# Patient Record
Sex: Female | Born: 1990 | Race: Black or African American | Hispanic: No | Marital: Single | Smoking: Current every day smoker
Health system: Southern US, Community
[De-identification: ages and names within clinical notes are randomized; demographics above are authoritative.]

## PROBLEM LIST (undated history)

## (undated) DIAGNOSIS — R011 Cardiac murmur, unspecified: Secondary | ICD-10-CM

## (undated) DIAGNOSIS — R51 Headache: Secondary | ICD-10-CM

## (undated) HISTORY — PX: CYST REMOVAL LEG: SHX6280

## (undated) HISTORY — PX: WISDOM TOOTH EXTRACTION: SHX21

## (undated) HISTORY — PX: OTHER SURGICAL HISTORY: SHX169

## (undated) HISTORY — PX: TYMPANOSTOMY TUBE PLACEMENT: SHX32

## (undated) HISTORY — DX: Headache: R51

---
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
## 2012-01-25 ENCOUNTER — Encounter: Payer: BC Managed Care – PPO | Admitting: Obstetrics and Gynecology

## 2012-01-25 ENCOUNTER — Telehealth: Payer: Self-pay | Admitting: Obstetrics and Gynecology

## 2012-01-25 NOTE — Telephone Encounter (Signed)
ND has available apts on 02/04/2012

## 2012-02-04 ENCOUNTER — Ambulatory Visit (INDEPENDENT_AMBULATORY_CARE_PROVIDER_SITE_OTHER): Payer: BC Managed Care – PPO | Admitting: Obstetrics and Gynecology

## 2012-02-04 ENCOUNTER — Encounter: Payer: Self-pay | Admitting: Obstetrics and Gynecology

## 2012-02-04 VITALS — BP 110/60 | Ht 63.0 in | Wt 129.0 lb

## 2012-02-04 DIAGNOSIS — Z202 Contact with and (suspected) exposure to infections with a predominantly sexual mode of transmission: Secondary | ICD-10-CM

## 2012-02-04 DIAGNOSIS — A749 Chlamydial infection, unspecified: Secondary | ICD-10-CM

## 2012-02-04 DIAGNOSIS — Z2089 Contact with and (suspected) exposure to other communicable diseases: Secondary | ICD-10-CM

## 2012-02-04 NOTE — Progress Notes (Signed)
TOC chlamydia.  Pt without c/o BP 110/60  Ht 5\' 3"  (1.6 m)  Wt 129 lb (58.514 kg)  BMI 22.85 kg/m2  LMP 12/27/2011 Physical Examination: General appearance - alert, well appearing, and in no distress Abdomen - soft, nontender, nondistended, no masses or organomegaly Pelvic - normal external genitalia, vulva, vagina, cervix, uterus and adnexa Chlamydia infection Pt and partner txd TOC done today RT for aex

## 2012-02-22 ENCOUNTER — Telehealth: Payer: Self-pay | Admitting: Obstetrics and Gynecology

## 2012-02-22 NOTE — Telephone Encounter (Signed)
Tc to pt per telephone call. Informed pt GC/CT-both negative. Pt voices understanding.

## 2012-07-17 ENCOUNTER — Encounter (HOSPITAL_COMMUNITY): Payer: Self-pay

## 2012-07-17 ENCOUNTER — Emergency Department (INDEPENDENT_AMBULATORY_CARE_PROVIDER_SITE_OTHER)
Admission: EM | Admit: 2012-07-17 | Discharge: 2012-07-17 | Disposition: A | Discharge status: Home / Self Care | Payer: Self-pay | Source: Home / Self Care

## 2012-07-17 DIAGNOSIS — S46819A Strain of other muscles, fascia and tendons at shoulder and upper arm level, unspecified arm, initial encounter: Secondary | ICD-10-CM

## 2012-07-17 DIAGNOSIS — S5010XA Contusion of unspecified forearm, initial encounter: Secondary | ICD-10-CM

## 2012-07-17 DIAGNOSIS — IMO0002 Reserved for concepts with insufficient information to code with codable children: Secondary | ICD-10-CM

## 2012-07-17 DIAGNOSIS — S5012XA Contusion of left forearm, initial encounter: Secondary | ICD-10-CM

## 2012-07-17 DIAGNOSIS — S60511A Abrasion of right hand, initial encounter: Secondary | ICD-10-CM

## 2012-07-17 DIAGNOSIS — S46811A Strain of other muscles, fascia and tendons at shoulder and upper arm level, right arm, initial encounter: Secondary | ICD-10-CM
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
## 2012-08-01 NOTE — ED Provider Notes (Signed)
Medical screening examination/treatment/procedure(s) were performed by resident physician or non-physician practitioner and as supervising physician I was immediately available for consultation/collaboration.   Barkley Bruns MD.   Linna Hoff, MD 08/01/12 [phone]

## 2012-11-24 DIAGNOSIS — A749 Chlamydial infection, unspecified: Secondary | ICD-10-CM | POA: Insufficient documentation

## 2013-01-25 NOTE — L&D Delivery Note (Signed)
Delivery Note At 7:02 PM a viable female was delivered via Vaginal, Spontaneous Delivery (Presentation: Left Occiput Anterior).  APGAR: 8, 9; weight pending.   Placenta status: Intact, Spontaneous.  Cord: 3 vessels with the following complications: None.  Cord pH: not collected  Anesthesia: Epidural  Episiotomy: None Lacerations: Vaginal left side Suture Repair: 3.0 vicryl Est. Blood Loss (mL): 300  Mom to postpartum.  Baby to Couplet care / Skin to Skin.  Routine PP orders Bottle feeding Out patient circ  Haroldine LawsOXLEY, Robel Wuertz 08/12/2013, 7:19 PM

## 2013-03-05 LAB — OB RESULTS CONSOLE GBS: GBS: POSITIVE

## 2013-03-20 ENCOUNTER — Encounter (HOSPITAL_COMMUNITY): Payer: Self-pay | Admitting: *Deleted

## 2013-03-20 ENCOUNTER — Inpatient Hospital Stay (HOSPITAL_COMMUNITY)
Admission: AD | Admit: 2013-03-20 | Discharge: 2013-03-20 | Disposition: A | Discharge status: Home / Self Care | Payer: Medicaid Other | Source: Ambulatory Visit | Attending: Obstetrics and Gynecology | Admitting: Obstetrics and Gynecology

## 2013-03-20 DIAGNOSIS — O99891 Other specified diseases and conditions complicating pregnancy: Secondary | ICD-10-CM | POA: Insufficient documentation

## 2013-03-20 DIAGNOSIS — R109 Unspecified abdominal pain: Secondary | ICD-10-CM | POA: Insufficient documentation

## 2013-03-20 DIAGNOSIS — O9989 Other specified diseases and conditions complicating pregnancy, childbirth and the puerperium: Principal | ICD-10-CM

## 2013-03-20 LAB — COMPREHENSIVE METABOLIC PANEL
ALBUMIN: 3.1 g/dL — AB (ref 3.5–5.2)
ALK PHOS: 30 U/L — AB (ref 39–117)
ALT: 24 U/L (ref 0–35)
AST: 19 U/L (ref 0–37)
BUN: 6 mg/dL (ref 6–23)
CHLORIDE: 102 meq/L (ref 96–112)
CO2: 24 mEq/L (ref 19–32)
Calcium: 8.8 mg/dL (ref 8.4–10.5)
Creatinine, Ser: 0.43 mg/dL — ABNORMAL LOW (ref 0.50–1.10)
GFR calc Af Amer: >90 mL/min (ref >90)
GFR calc non Af Amer: >90 mL/min (ref >90)
Glucose, Bld: 76 mg/dL (ref 70–99)
POTASSIUM: 3.9 meq/L (ref 3.7–5.3)
Sodium: 136 mEq/L — ABNORMAL LOW (ref 137–147)
Total Bilirubin: 0.2 mg/dL — ABNORMAL LOW (ref 0.3–1.2)
Total Protein: 6.4 g/dL (ref 6.0–8.3)

## 2013-03-20 LAB — CBC WITH DIFFERENTIAL/PLATELET
Basophils Absolute: 0 10*3/uL (ref 0.0–0.1)
Basophils Relative: 0 % (ref 0–1)
Eosinophils Absolute: 0 10*3/uL (ref 0.0–0.7)
Eosinophils Relative: 0 % (ref 0–5)
HCT: 33 % — ABNORMAL LOW (ref 36.0–46.0)
HEMOGLOBIN: 11.5 g/dL — AB (ref 12.0–15.0)
Lymphocytes Relative: 21 % (ref 12–46)
Lymphs Abs: 1.9 10*3/uL (ref 0.7–4.0)
MCH: 31.3 pg (ref 26.0–34.0)
MCHC: 34.8 g/dL (ref 30.0–36.0)
MCV: 89.7 fL (ref 78.0–100.0)
MONOS PCT: 5 % (ref 3–12)
Monocytes Absolute: 0.5 10*3/uL (ref 0.1–1.0)
NEUTROS ABS: 6.7 10*3/uL (ref 1.7–7.7)
NEUTROS PCT: 74 % (ref 43–77)
Platelets: 167 10*3/uL (ref 150–400)
RBC: 3.68 MIL/uL — ABNORMAL LOW (ref 3.87–5.11)
RDW: 13.6 % (ref 11.5–15.5)
WBC: 9.1 10*3/uL (ref 4.0–10.5)

## 2013-03-20 LAB — URINALYSIS, ROUTINE W REFLEX MICROSCOPIC
BILIRUBIN URINE: NEGATIVE
Glucose, UA: NEGATIVE mg/dL
Hgb urine dipstick: NEGATIVE
KETONES UR: NEGATIVE mg/dL
Leukocytes, UA: NEGATIVE
NITRITE: NEGATIVE
PH: 6 (ref 5.0–8.0)
PROTEIN: NEGATIVE mg/dL
Specific Gravity, Urine: >1.03 — ABNORMAL HIGH (ref 1.005–1.030)
Urobilinogen, UA: 0.2 mg/dL (ref 0.0–1.0)

## 2013-03-20 NOTE — Discharge Instructions (Signed)
Second Trimester of Pregnancy The second trimester is from week 13 through week 28, months 4 through 6. The second trimester is often a time when you feel your best. Your body has also adjusted to being pregnant, and you begin to feel better physically. Usually, morning sickness has lessened or quit completely, you may have more energy, and you may have an increase in appetite. The second trimester is also a time when the fetus is growing rapidly. At the end of the sixth month, the fetus is about 9 inches long and weighs about 1 pounds. You will likely begin to feel the baby move (quickening) between 18 and 20 weeks of the pregnancy. BODY CHANGES Your body goes through many changes during pregnancy. The changes vary from woman to woman.   Your weight will continue to increase. You will notice your lower abdomen bulging out.  You may begin to get stretch marks on your hips, abdomen, and breasts.  You may develop headaches that can be relieved by medicines approved by your caregiver.  You may urinate more often because the fetus is pressing on your bladder.  You may develop or continue to have heartburn as a result of your pregnancy.  You may develop constipation because certain hormones are causing the muscles that push waste through your intestines to slow down.  You may develop hemorrhoids or swollen, bulging veins (varicose veins).  You may have back pain because of the weight gain and pregnancy hormones relaxing your joints between the bones in your pelvis and as a result of a shift in weight and the muscles that support your balance.  Your breasts will continue to grow and be tender.  Your gums may bleed and may be sensitive to brushing and flossing.  Dark spots or blotches (chloasma, mask of pregnancy) may develop on your face. This will likely fade after the baby is born.  A dark line from your belly button to the pubic area (linea nigra) may appear. This will likely fade after the  baby is born. WHAT TO EXPECT AT YOUR PRENATAL VISITS During a routine prenatal visit:  You will be weighed to make sure you and the fetus are growing normally.  Your blood pressure will be taken.  Your abdomen will be measured to track your baby's growth.  The fetal heartbeat will be listened to.  Any test results from the previous visit will be discussed. Your caregiver may ask you:  How you are feeling.  If you are feeling the baby move.  If you have had any abnormal symptoms, such as leaking fluid, bleeding, severe headaches, or abdominal cramping.  If you have any questions. Other tests that may be performed during your second trimester include:  Blood tests that check for:  Low iron levels (anemia).  Gestational diabetes (between 24 and 28 weeks).  Rh antibodies.  Urine tests to check for infections, diabetes, or protein in the urine.  An ultrasound to confirm the proper growth and development of the baby.  An amniocentesis to check for possible genetic problems.  Fetal screens for spina bifida and Down syndrome. HOME CARE INSTRUCTIONS   Avoid all smoking, herbs, alcohol, and unprescribed drugs. These chemicals affect the formation and growth of the baby.  Follow your caregiver's instructions regarding medicine use. There are medicines that are either safe or unsafe to take during pregnancy.  Exercise only as directed by your caregiver. Experiencing uterine cramps is a good sign to stop exercising.  Continue to eat regular,   healthy meals.  Wear a good support bra for breast tenderness.  Do not use hot tubs, steam rooms, or saunas.  Wear your seat belt at all times when driving.  Avoid raw meat, uncooked cheese, cat litter boxes, and soil used by cats. These carry germs that can cause birth defects in the baby.  Take your prenatal vitamins.  Try taking a stool softener (if your caregiver approves) if you develop constipation. Eat more high-fiber foods,  such as fresh vegetables or fruit and whole grains. Drink plenty of fluids to keep your urine clear or pale yellow.  Take warm sitz baths to soothe any pain or discomfort caused by hemorrhoids. Use hemorrhoid cream if your caregiver approves.  If you develop varicose veins, wear support hose. Elevate your feet for 15 minutes, 3 4 times a day. Limit salt in your diet.  Avoid heavy lifting, wear low heel shoes, and practice good posture.  Rest with your legs elevated if you have leg cramps or low back pain.  Visit your dentist if you have not gone yet during your pregnancy. Use a soft toothbrush to brush your teeth and be gentle when you floss.  A sexual relationship may be continued unless your caregiver directs you otherwise.  Continue to go to all your prenatal visits as directed by your caregiver. SEEK MEDICAL CARE IF:   You have dizziness.  You have mild pelvic cramps, pelvic pressure, or nagging pain in the abdominal area.  You have persistent nausea, vomiting, or diarrhea.  You have a bad smelling vaginal discharge.  You have pain with urination. SEEK IMMEDIATE MEDICAL CARE IF:   You have a fever.  You are leaking fluid from your vagina.  You have spotting or bleeding from your vagina.  You have severe abdominal cramping or pain.  You have rapid weight gain or loss.  You have shortness of breath with chest pain.  You notice sudden or extreme swelling of your face, hands, ankles, feet, or legs.  You have not felt your baby move in over an hour.  You have severe headaches that do not go away with medicine.  You have vision changes. Document Released: 01/05/2001 Document Revised: 09/13/2012 Document Reviewed: 03/14/2012 Exit[employer] Patient Information 2014 [employer].  

## 2013-03-20 NOTE — MAU Note (Signed)
Pain in lower abd,first noted yesterday - was worse today.

## 2013-03-20 NOTE — MAU Provider Note (Signed)
History   23 yo, G1P0 at [redacted]w[redacted]d present with constant lower abdominal pain, particularly when she is sitting down, since yesterday but complains it is worse today.  Over the course of MAU stay, pain is better but she reports it is still noted when she engages her abdominal muscles to sit up.  Pt has not tried any medication for pain relief.  Denies VB, LOF, recent fever, resp or GI c/o's, UTI s/s.   Chief Complaint  Patient presents with  . Abdominal Pain   HPI  OB History   Grav Para Term Preterm Abortions TAB SAB Ect Mult Living   1 0 0 0 0 0 0 0 0 0       Past Medical History  Diagnosis Date  . ZOXWRUEA(540.9)     Past Surgical History  Procedure Laterality Date  . Wisdom tooth extraction    . Adnoids    . Tympanostomy tube placement    . Cyst removal leg      right back thigh    Family History  Problem Relation Age of Onset  . Hypertension Maternal Grandmother   . Asthma Maternal Grandmother   . Thyroid disease Mother   . Migraines Brother     History  Substance Use Topics  . Smoking status: Never Smoker   . Smokeless tobacco: Never Used  . Alcohol Use: No    Allergies: No Known Allergies  Prescriptions prior to admission  Medication Sig Dispense Refill  . Prenatal Vit-Fe Fumarate-FA (PRENATAL MULTIVITAMIN) TABS tablet Take 1 tablet by mouth every morning.        ROS ROS: see HPI above, all other systems are negative  Physical Exam   Blood pressure 111/61, pulse 83, temperature 98.5 F (36.9 C), temperature source Oral, resp. rate 18, height 5\' 2"  (1.575 m), weight 138 lb (62.596 kg).  Physical Exam Chest: Clear Heart: RRR Abdomen: gravid, NT Extremities: WNL  FHT: Doppler 164 UCs: None  Results for orders placed during the hospital encounter of 03/20/13 (from the past 24 hour(s))  URINALYSIS, ROUTINE W REFLEX MICROSCOPIC     Status: Abnormal   Collection Time    03/20/13  4:05 PM      Result Value Ref Range   Color, Urine YELLOW  YELLOW   APPearance CLEAR  CLEAR   Specific Gravity, Urine >1.030 (*) 1.005 - 1.030   pH 6.0  5.0 - 8.0   Glucose, UA NEGATIVE  NEGATIVE mg/dL   Hgb urine dipstick NEGATIVE  NEGATIVE   Bilirubin Urine NEGATIVE  NEGATIVE   Ketones, ur NEGATIVE  NEGATIVE mg/dL   Protein, ur NEGATIVE  NEGATIVE mg/dL   Urobilinogen, UA 0.2  0.0 - 1.0 mg/dL   Nitrite NEGATIVE  NEGATIVE   Leukocytes, UA NEGATIVE  NEGATIVE  CBC WITH DIFFERENTIAL     Status: Abnormal   Collection Time    03/20/13  5:30 PM      Result Value Ref Range   WBC 9.1  4.0 - 10.5 K/uL   RBC 3.68 (*) 3.87 - 5.11 MIL/uL   Hemoglobin 11.5 (*) 12.0 - 15.0 g/dL   HCT 81.1 (*) 91.4 - 78.2 %   MCV 89.7  78.0 - 100.0 fL   MCH 31.3  26.0 - 34.0 pg   MCHC 34.8  30.0 - 36.0 g/dL   RDW 95.6  21.3 - 08.6 %   Platelets 167  150 - 400 K/uL   Neutrophils Relative % 74  43 - 77 %   Neutro  Abs 6.7  1.7 - 7.7 K/uL   Lymphocytes Relative 21  12 - 46 %   Lymphs Abs 1.9  0.7 - 4.0 K/uL   Monocytes Relative 5  3 - 12 %   Monocytes Absolute 0.5  0.1 - 1.0 K/uL   Eosinophils Relative 0  0 - 5 %   Eosinophils Absolute 0.0  0.0 - 0.7 K/uL   Basophils Relative 0  0 - 1 %   Basophils Absolute 0.0  0.0 - 0.1 K/uL  COMPREHENSIVE METABOLIC PANEL     Status: Abnormal   Collection Time    03/20/13  5:30 PM      Result Value Ref Range   Sodium 136 (*) 137 - 147 mEq/L   Potassium 3.9  3.7 - 5.3 mEq/L   Chloride 102  96 - 112 mEq/L   CO2 24  19 - 32 mEq/L   Glucose, Bld 76  70 - 99 mg/dL   BUN 6  6 - 23 mg/dL   Creatinine, Ser 6.570.43 (*) 0.50 - 1.10 mg/dL   Calcium 8.8  8.4 - 84.610.5 mg/dL   Total Protein 6.4  6.0 - 8.3 g/dL   Albumin 3.1 (*) 3.5 - 5.2 g/dL   AST 19  0 - 37 U/L   ALT 24  0 - 35 U/L   Alkaline Phosphatase 30 (*) 39 - 117 U/L   Total Bilirubin 0.2 (*) 0.3 - 1.2 mg/dL   GFR calc non Af Amer >90  >90 mL/min   GFR calc Af Amer >90  >90 mL/min    ED Course  IUP at 748w1d Lower abdominal pain  Labs - WNL  Reassurance given regarding normal  discomforts of pregnancy Pt has an anatomy scan and ROB scheduled for 3/2 D/c home with precaustions     Haroldine LawsOXLEY, Terina Mcelhinny CNM, MSN 03/20/2013 7:44 PM

## 2013-05-09 LAB — OB RESULTS CONSOLE RPR: RPR: NONREACTIVE

## 2013-05-09 LAB — OB RESULTS CONSOLE HIV ANTIBODY (ROUTINE TESTING): HIV: NONREACTIVE

## 2013-05-09 LAB — OB RESULTS CONSOLE GC/CHLAMYDIA
Chlamydia: NEGATIVE
GC PROBE AMP, GENITAL: NEGATIVE

## 2013-05-09 LAB — OB RESULTS CONSOLE ABO/RH: RH Type: POSITIVE

## 2013-05-09 LAB — OB RESULTS CONSOLE HEPATITIS B SURFACE ANTIGEN: HEP B S AG: NEGATIVE

## 2013-07-16 ENCOUNTER — Encounter (HOSPITAL_COMMUNITY): Payer: Self-pay

## 2013-07-23 ENCOUNTER — Encounter (HOSPITAL_COMMUNITY): Payer: Self-pay | Admitting: *Deleted

## 2013-07-23 ENCOUNTER — Inpatient Hospital Stay (HOSPITAL_COMMUNITY)
Admission: AD | Admit: 2013-07-23 | Discharge: 2013-07-23 | Disposition: A | Discharge status: Home / Self Care | Payer: Medicaid Other | Source: Ambulatory Visit | Attending: Obstetrics and Gynecology | Admitting: Obstetrics and Gynecology

## 2013-07-23 DIAGNOSIS — A499 Bacterial infection, unspecified: Secondary | ICD-10-CM | POA: Diagnosis not present

## 2013-07-23 DIAGNOSIS — E860 Dehydration: Secondary | ICD-10-CM | POA: Insufficient documentation

## 2013-07-23 DIAGNOSIS — B9689 Other specified bacterial agents as the cause of diseases classified elsewhere: Secondary | ICD-10-CM | POA: Diagnosis not present

## 2013-07-23 DIAGNOSIS — O2390 Unspecified genitourinary tract infection in pregnancy, unspecified trimester: Secondary | ICD-10-CM | POA: Diagnosis not present

## 2013-07-23 DIAGNOSIS — N760 Acute vaginitis: Secondary | ICD-10-CM | POA: Diagnosis not present

## 2013-07-23 DIAGNOSIS — R8271 Bacteriuria: Secondary | ICD-10-CM

## 2013-07-23 DIAGNOSIS — O26859 Spotting complicating pregnancy, unspecified trimester: Secondary | ICD-10-CM | POA: Diagnosis present

## 2013-07-23 LAB — WET PREP, GENITAL
Trich, Wet Prep: NONE SEEN
Yeast Wet Prep HPF POC: NONE SEEN

## 2013-07-23 NOTE — MAU Note (Signed)
Patient states she started spotting last night and continued today. States she has had some mild cramping. Denies leaking and reports good fetal movement.

## 2013-07-23 NOTE — Discharge Instructions (Signed)
Bacterial Vaginosis Bacterial vaginosis is a vaginal infection that occurs when the normal balance of bacteria in the vagina is disrupted. It results from an overgrowth of certain bacteria. This is the most common vaginal infection in women of childbearing age. Treatment is important to prevent complications, especially in pregnant women, as it can cause a premature delivery. CAUSES  Bacterial vaginosis is caused by an increase in harmful bacteria that are normally present in smaller amounts in the vagina. Several different kinds of bacteria can cause bacterial vaginosis. However, the reason that the condition develops is not fully understood. RISK FACTORS Certain activities or behaviors can put you at an increased risk of developing bacterial vaginosis, including:  Having a new sex partner or multiple sex partners.  Douching.  Using an intrauterine device (IUD) for contraception. Women do not get bacterial vaginosis from toilet seats, bedding, swimming pools, or contact with objects around them. SIGNS AND SYMPTOMS  Some women with bacterial vaginosis have no signs or symptoms. Common symptoms include:  Grey vaginal discharge.  A fishlike odor with discharge, especially after sexual intercourse.  Itching or burning of the vagina and vulva.  Burning or pain with urination. DIAGNOSIS  Your health care provider will take a medical history and examine the vagina for signs of bacterial vaginosis. A sample of vaginal fluid may be taken. Your health care provider will look at this sample under a microscope to check for bacteria and abnormal cells. A vaginal pH test may also be done.  TREATMENT  Bacterial vaginosis may be treated with antibiotic medicines. These may be given in the form of a pill or a vaginal cream. A second round of antibiotics may be prescribed if the condition comes back after treatment.  HOME CARE INSTRUCTIONS   Only take over-the-counter or prescription medicines as  directed by your health care provider.  If antibiotic medicine was prescribed, take it as directed. Make sure you finish it even if you start to feel better.  Do not have sex until treatment is completed.  Tell all sexual partners that you have a vaginal infection. They should see their health care provider and be treated if they have problems, such as a mild rash or itching.  Practice safe sex by using condoms and only having one sex partner. SEEK MEDICAL CARE IF:   Your symptoms are not improving after 3 days of treatment.  You have increased discharge or pain.  You have a fever. MAKE SURE YOU:   Understand these instructions.  Will watch your condition.  Will get help right away if you are not doing well or get worse. FOR MORE INFORMATION  Centers for Disease Control and Prevention, Division of STD Prevention: www.cdc.gov/std American Sexual Health Association (ASHA): www.ashastd.org  Document Released: 01/11/2005 Document Revised: 11/01/2012 Document Reviewed: 08/23/2012 Exit[employer] Patient Information 2015 [employer]. This information is not intended to replace advice given to you by your health care provider. Make sure you discuss any questions you have with your health care provider.  

## 2013-07-25 NOTE — MAU Provider Note (Signed)
History  23 yo G1P0 @ 36.0 wks by u/s presents to MAU after calling the office w/ c/o overnight spotting and cramping that has continued throughout the day. Denies recent intercourse. Reports active fetus. Denies LOF.  Patient Active Problem List   Diagnosis Date Noted  . Bacterial vaginosis - 7 day treatment beginning 07/23/13 07/23/2013  . GBS bacteriuria - treat in labor 07/23/2013  . Chlamydia infection 02/04/2012  . Headache     Chief Complaint  Patient presents with  . Vaginal Discharge  . Abdominal Cramping   HPI See above OB History   Grav Para Term Preterm Abortions TAB SAB Ect Mult Living   1 0 0 0 0 0 0 0 0 0       Past Medical History  Diagnosis Date  . WUJWJXBJ(478.2Headache(784.0)     Past Surgical History  Procedure Laterality Date  . Wisdom tooth extraction    . Adnoids    . Tympanostomy tube placement    . Cyst removal leg      right back thigh    Family History  Problem Relation Age of Onset  . Hypertension Maternal Grandmother   . Asthma Maternal Grandmother   . Thyroid disease Mother   . Migraines Brother     History  Substance Use Topics  . Smoking status: Never Smoker   . Smokeless tobacco: Never Used  . Alcohol Use: No    Allergies: No Known Allergies  No prescriptions prior to admission    ROS Cramping Spotting +FM Physical Exam   Blood pressure 107/60, pulse 106, temperature 98.6 F (37 C), resp. rate 18, height 5\' 2"  (1.575 m), weight 167 lb 3.2 oz (75.841 kg), SpO2 100.00%.  Results for orders placed during the hospital encounter of 07/23/13 (from the past 72 hour(s))  WET PREP, GENITAL     Status: Abnormal   Collection Time    07/23/13  5:59 PM      Result Value Ref Range   Yeast Wet Prep HPF POC NONE SEEN  NONE SEEN   Trich, Wet Prep NONE SEEN  NONE SEEN   Clue Cells Wet Prep HPF POC MODERATE (*) NONE SEEN   WBC, Wet Prep HPF POC MANY (*) NONE SEEN   Comment: MANY BACTERIA SEEN    Posterior placenta noted @ 19 wks; no  previa.  Physical Exam Gen: NAD Lungs: CTAB CV: RRR Abd: gravid, soft, slightly tender to palpation, non-distended Pelvic: cervical ectropion - small bleeding noted w/ placement of speculum; area hemostatic w/ fox swab. Sample taken for wet prep. No odor. Cervix long/thick/closed FHR: Cat 1 Occ ctxs Ext: WNL ED Course  Assessment: BV as evidenced by mod clue cells on wet prep Dehydration  Plan: Metronidazole po course Pelvic rest Work note - OOW until appt on 07/25/13 Hydrate Strict PTL precautions Continue DFMCs per protocol   Sherre ScarletWILLIAMS, Brigett Estell CNM, MS 07/23/13 @ 6:15 PM

## 2013-08-11 ENCOUNTER — Encounter (HOSPITAL_COMMUNITY): Payer: Self-pay | Admitting: *Deleted

## 2013-08-11 ENCOUNTER — Inpatient Hospital Stay (HOSPITAL_COMMUNITY)
Admission: AD | Admit: 2013-08-11 | Discharge: 2013-08-14 | DRG: 775 | Disposition: A | Discharge status: Home / Self Care | Payer: Medicaid Other | Source: Ambulatory Visit | Attending: Obstetrics and Gynecology | Admitting: Obstetrics and Gynecology

## 2013-08-11 DIAGNOSIS — O9902 Anemia complicating childbirth: Secondary | ICD-10-CM | POA: Diagnosis present

## 2013-08-11 DIAGNOSIS — O9989 Other specified diseases and conditions complicating pregnancy, childbirth and the puerperium: Secondary | ICD-10-CM

## 2013-08-11 DIAGNOSIS — D649 Anemia, unspecified: Secondary | ICD-10-CM | POA: Diagnosis present

## 2013-08-11 DIAGNOSIS — Z22330 Carrier of Group B streptococcus: Secondary | ICD-10-CM | POA: Diagnosis not present

## 2013-08-11 DIAGNOSIS — O9912 Other diseases of the blood and blood-forming organs and certain disorders involving the immune mechanism complicating childbirth: Secondary | ICD-10-CM | POA: Diagnosis present

## 2013-08-11 DIAGNOSIS — O479 False labor, unspecified: Secondary | ICD-10-CM | POA: Diagnosis present

## 2013-08-11 DIAGNOSIS — O4290 Premature rupture of membranes, unspecified as to length of time between rupture and onset of labor, unspecified weeks of gestation: Secondary | ICD-10-CM | POA: Diagnosis present

## 2013-08-11 DIAGNOSIS — Z825 Family history of asthma and other chronic lower respiratory diseases: Secondary | ICD-10-CM | POA: Diagnosis not present

## 2013-08-11 DIAGNOSIS — O99892 Other specified diseases and conditions complicating childbirth: Secondary | ICD-10-CM | POA: Diagnosis present

## 2013-08-11 DIAGNOSIS — D696 Thrombocytopenia, unspecified: Secondary | ICD-10-CM | POA: Diagnosis present

## 2013-08-11 DIAGNOSIS — D689 Coagulation defect, unspecified: Secondary | ICD-10-CM | POA: Diagnosis present

## 2013-08-11 HISTORY — DX: Cardiac murmur, unspecified: R01.1

## 2013-08-11 LAB — CBC
HCT: 33 % — ABNORMAL LOW (ref 36.0–46.0)
HEMOGLOBIN: 10.9 g/dL — AB (ref 12.0–15.0)
MCH: 28.8 pg (ref 26.0–34.0)
MCHC: 33 g/dL (ref 30.0–36.0)
MCV: 87.1 fL (ref 78.0–100.0)
Platelets: 178 10*3/uL (ref 150–400)
RBC: 3.79 MIL/uL — ABNORMAL LOW (ref 3.87–5.11)
RDW: 13.7 % (ref 11.5–15.5)
WBC: 8.3 10*3/uL (ref 4.0–10.5)

## 2013-08-11 LAB — TYPE AND SCREEN
ABO/RH(D): B POS
Antibody Screen: NEGATIVE

## 2013-08-11 MED ORDER — LACTATED RINGERS IV SOLN: 500.0000 mL | INTRAVENOUS | Status: DC | PRN

## 2013-08-11 MED ORDER — OXYTOCIN 40 UNITS IN LACTATED RINGERS INFUSION - SIMPLE MED
62.5000 mL/h | INTRAVENOUS | Status: DC
  Administered 2013-08-12: 62.5 mL/h via INTRAVENOUS

## 2013-08-11 MED ORDER — LIDOCAINE HCL (PF) 1 % IJ SOLN
30.0000 mL | INTRAMUSCULAR | Status: DC | PRN
  Filled 2013-08-11: qty 30

## 2013-08-11 MED ORDER — PENICILLIN G POTASSIUM 5000000 UNITS IJ SOLR
2.5000 10*6.[IU] | INTRAVENOUS | Status: DC
  Administered 2013-08-12 (×5): 2.5 10*6.[IU] via INTRAVENOUS
  Filled 2013-08-11 (×8): qty 2.5

## 2013-08-11 MED ORDER — EPHEDRINE 5 MG/ML INJ
10.0000 mg | INTRAVENOUS | Status: DC | PRN
  Filled 2013-08-11: qty 2

## 2013-08-11 MED ORDER — DEXTROSE 5 % IV SOLN
5.0000 10*6.[IU] | Freq: Once | INTRAVENOUS | Status: AC
  Administered 2013-08-11: 5 10*6.[IU] via INTRAVENOUS
  Filled 2013-08-11: qty 5

## 2013-08-11 MED ORDER — ONDANSETRON HCL 4 MG/2ML IJ SOLN: 4.0000 mg | Freq: Four times a day (QID) | INTRAMUSCULAR | Status: DC | PRN

## 2013-08-11 MED ORDER — CITRIC ACID-SODIUM CITRATE 334-500 MG/5ML PO SOLN: 30.0000 mL | ORAL | Status: DC | PRN

## 2013-08-11 MED ORDER — ACETAMINOPHEN 325 MG PO TABS: 650.0000 mg | ORAL_TABLET | ORAL | Status: DC | PRN

## 2013-08-11 MED ORDER — FENTANYL 2.5 MCG/ML BUPIVACAINE 1/10 % EPIDURAL INFUSION (WH - ANES)
14.0000 mL/h | INTRAMUSCULAR | Status: DC | PRN
  Administered 2013-08-12 (×2): 14 mL/h via EPIDURAL
  Filled 2013-08-11 (×2): qty 125

## 2013-08-11 MED ORDER — LACTATED RINGERS IV SOLN
INTRAVENOUS | Status: DC
  Administered 2013-08-11 – 2013-08-12 (×4): via INTRAVENOUS

## 2013-08-11 MED ORDER — PHENYLEPHRINE 40 MCG/ML (10ML) SYRINGE FOR IV PUSH (FOR BLOOD PRESSURE SUPPORT)
80.0000 ug | PREFILLED_SYRINGE | INTRAVENOUS | Status: DC | PRN
  Filled 2013-08-11: qty 2
  Filled 2013-08-11: qty 10

## 2013-08-11 MED ORDER — OXYCODONE-ACETAMINOPHEN 5-325 MG PO TABS: 1.0000 | ORAL_TABLET | ORAL | Status: DC | PRN

## 2013-08-11 MED ORDER — PHENYLEPHRINE 40 MCG/ML (10ML) SYRINGE FOR IV PUSH (FOR BLOOD PRESSURE SUPPORT)
80.0000 ug | PREFILLED_SYRINGE | INTRAVENOUS | Status: DC | PRN
  Filled 2013-08-11: qty 2

## 2013-08-11 MED ORDER — IBUPROFEN 600 MG PO TABS
600.0000 mg | ORAL_TABLET | Freq: Four times a day (QID) | ORAL | Status: DC | PRN
  Filled 2013-08-11: qty 1

## 2013-08-11 MED ORDER — OXYTOCIN 40 UNITS IN LACTATED RINGERS INFUSION - SIMPLE MED
1.0000 m[IU]/min | INTRAVENOUS | Status: DC
Start: 2013-08-11 — End: 2013-08-12
  Administered 2013-08-11: 2 m[IU]/min via INTRAVENOUS
  Administered 2013-08-12: 10 m[IU]/min via INTRAVENOUS
  Filled 2013-08-11: qty 1000

## 2013-08-11 MED ORDER — OXYTOCIN BOLUS FROM INFUSION
500.0000 mL | INTRAVENOUS | Status: DC
  Administered 2013-08-12: 500 mL via INTRAVENOUS

## 2013-08-11 MED ORDER — LACTATED RINGERS IV SOLN: 500.0000 mL | Freq: Once | INTRAVENOUS | Status: DC

## 2013-08-11 MED ORDER — DIPHENHYDRAMINE HCL 50 MG/ML IJ SOLN
12.5000 mg | INTRAMUSCULAR | Status: DC | PRN
  Administered 2013-08-12 (×2): 12.5 mg via INTRAVENOUS
  Filled 2013-08-11 (×2): qty 1

## 2013-08-11 NOTE — MAU Note (Signed)
Pt reports LOF @ 1730

## 2013-08-11 NOTE — H&P (Signed)
Samantha CairoWhitney S Dillon is a 23 y.o. female, G1 P0000 at 38.5 weeks  Patient Active Problem List   Diagnosis Date Noted  . Active labor at term 08/11/2013  . Bacterial vaginosis - 7 day treatment beginning 07/23/13 07/23/2013  . GBS bacteriuria - treat in labor 07/23/2013  . Chlamydia, pos 10/2012, tested neg 01/2013 11/24/2012  . Headache     Pregnancy Course: Patient entered care at 9.3 weeks.  EDC of 08/20/13 was established by US.   Anatomy scan:  19.0 weeks, with normal findings and an posterior placenta.   Additional US evaluations:  Dating 01/30/13 at 11.1   Significant prenatal events:  GBS+   Last evaluation:  38.2 weeks   VE: 2/80/-2 on 08/08/2013  Reason for admission: SROM clear  Pt States:     Contractions Frequency: none     Contraction severity: none     Fetal activity: +FM   OB History   Grav Para Term Preterm Abortions TAB SAB Ect Mult Living   1 0 0 0 0 0 0 0 0 0      Past Medical History  Diagnosis Date  . Headache(784.0)   . Heart murmur     As a child   Past Surgical History  Procedure Laterality Date  . Wisdom tooth extraction    . Adnoids    . Tympanostomy tube placement    . Cyst removal leg      right back thigh   Family History: family history includes Asthma in her maternal grandmother; Hypertension in her maternal grandmother; Migraines in her brother; Thyroid disease in her mother. Social History:  reports that she has never smoked. She has never used smokeless tobacco. She reports that she does not drink alcohol or use illicit drugs.   Prenatal Transfer Tool  Maternal Diabetes: No Genetic Screening: Normal 01/30/2013 Maternal Ultrasounds/Referrals: Normal Fetal Ultrasounds or other Referrals:  None Maternal Substance Abuse:  No Significant Maternal Medications:  None Significant Maternal Lab Results: Lab values include: Group B Strep positive, Hx of pos Chlamydia 10/2012 and neg on 01/2013    ROS:  See HPI above, all other systems are  negative  No Known Allergies  Filed Vitals:   08/11/13 1819 08/11/13 1957 08/11/13 2014  BP: 124/63 131/70   Pulse: 113 92   Temp: 98.5 F (36.9 C) 98.1 F (36.7 C)   TempSrc:  Oral   Resp: 16 18   Height:   5\' 2"  (1.575 m)  Weight:   164 lb (74.39 kg)     Maternal Exam:  Uterine Assessment: Contraction frequency is rare.  Abdomen: Gravid, non tender. Fundal height is aga.  Normal external genitalia, vulva, cervix, uterus and adnexa.  No lesions noted on exam.  EFW:  3295gm Fetal presentation: Vertex by exam Pelvis adequate for delivery.  VE:  2/80/-1 soft posterior  Fetal Exam:  Fetal Monitor Surveillance: continuous monitoring Mode: ultrasound.  FHR: Catagory  1 CTXs: n/a   Physical Exam: Nursing note and vitals reviewed.  She appears well nourished.  Psychiatric: Normal mood and affect. Her behavior is normal.  Head: Normocephalic.  Eyes: Pupils are equal, round, and reactive to light.  Neck: Normal range of motion.  Cardiovascular: RRR without murmur.  Respiratory: Clear bilaterally. Effort normal.  GI: Soft. Bowel sounds are normal.  Genitourinary: Vagina normal.  Musculoskeletal: Normal range of motion.  Ext: WNL, Homan's sign negative bilaterally Neurological: A&Ox3. Normal reflexes.  Skin: Warm and dry.    Prenatal labs: ABO, Rh: --/--/  B POS (07/18 1900)B pos Antibody: NEG (07/18 1900)neg Rubella:   pos RPR: Nonreactive (04/15 0000) NR HBsAg: Negative (04/15 0000) neg HIV: Non-reactive (04/15 0000) neg GBS: Positive (02/09 0000)pos Sickle cell/Hgb electrophoresis:  WNL Pap:  Neg 10/14 GC:  neg Chlamydia:  10/2012 pos, 01/2013 neg Genetic screenings:  Done at 13.3, results unknown Glucola:  WNL at 66    Assessment IUP at 38.5 Weeks Membrane: SROM clear 08/11/2013 at 1730, Fern positive Cervix is favorable GBS pos Desires epidural    Plan: Admit to L&D for management of labor. Possible augmentation options reviewed including foley  bulb, AROM and/or pitocin.  Routine L&D orders Anticipate SVD   IV pain medication per orders Epidural per patient request Foley cath after patient is comfortable with epidural GBS prophylaxis    Avah Bashor, CNM, MSN 08/11/2013, 10:30 PM

## 2013-08-11 NOTE — Progress Notes (Signed)
Patient ID: Basilio CairoWhitney S Wolters, female   DOB: 10/19/1990, 23 y.o.   MRN: [id number] Basilio CairoWhitney S Iyer is a 23 y.o. G1P0000 at 10053w5d admitted for PROM  Subjective: Some painful ctx, declines pain meds at present, [facility]   Objective: BP 131/70  Pulse 92  Temp(Src) 98.1 F (36.7 C) (Oral)  Resp 18  Ht 5\' 2"  (1.575 m)  Wt 164 lb (74.39 kg)  BMI 29.99 kg/m2       FHT:  Cat 1 UC:   toco irreg 3-6   SVE:   Dilation: 2.5 Effacement (%): 80 Station: -1 Exam by:: S. Maayan Jenning, CNM    Assessment / Plan:  Labor: PROM x 7hours Preeclampsia:  no s/s Fetal Wellbeing:  Category I Pain Control:  plans epidural Anticipated MOD:  NSVD  GBS pos, rcv'd PCN No cervical change since admission, will begin pitocin augmentation per protocol, consider IUPC prn    Dr Richardson Doppole updated    Sanda KleinLILLARD,Kaison Mcparland M 08/11/2013, 11:28 PM

## 2013-08-12 ENCOUNTER — Encounter (HOSPITAL_COMMUNITY): Payer: Medicaid Other | Admitting: Anesthesiology

## 2013-08-12 ENCOUNTER — Encounter (HOSPITAL_COMMUNITY): Payer: Self-pay

## 2013-08-12 ENCOUNTER — Inpatient Hospital Stay (HOSPITAL_COMMUNITY): Payer: Medicaid Other | Admitting: Anesthesiology

## 2013-08-12 LAB — ABO/RH: ABO/RH(D): B POS

## 2013-08-12 LAB — RPR

## 2013-08-12 MED ORDER — DIBUCAINE 1 % RE OINT: 1.0000 "application " | TOPICAL_OINTMENT | RECTAL | Status: DC | PRN

## 2013-08-12 MED ORDER — ZOLPIDEM TARTRATE 5 MG PO TABS: 5.0000 mg | ORAL_TABLET | Freq: Every evening | ORAL | Status: DC | PRN

## 2013-08-12 MED ORDER — OXYCODONE-ACETAMINOPHEN 5-325 MG PO TABS: 1.0000 | ORAL_TABLET | ORAL | Status: DC | PRN

## 2013-08-12 MED ORDER — ONDANSETRON HCL 4 MG PO TABS: 4.0000 mg | ORAL_TABLET | ORAL | Status: DC | PRN

## 2013-08-12 MED ORDER — TETANUS-DIPHTH-ACELL PERTUSSIS 5-2.5-18.5 LF-MCG/0.5 IM SUSP
0.5000 mL | Freq: Once | INTRAMUSCULAR | Status: AC
Start: 2013-08-13 — End: 2013-08-13
  Administered 2013-08-13: 0.5 mL via INTRAMUSCULAR

## 2013-08-12 MED ORDER — BENZOCAINE-MENTHOL 20-0.5 % EX AERO
1.0000 "application " | INHALATION_SPRAY | CUTANEOUS | Status: DC | PRN
  Filled 2013-08-12: qty 56

## 2013-08-12 MED ORDER — PRENATAL MULTIVITAMIN CH
1.0000 | ORAL_TABLET | Freq: Every day | ORAL | Status: DC
  Administered 2013-08-13 – 2013-08-14 (×2): 1 via ORAL
  Filled 2013-08-12 (×2): qty 1

## 2013-08-12 MED ORDER — WITCH HAZEL-GLYCERIN EX PADS: 1.0000 "application " | MEDICATED_PAD | CUTANEOUS | Status: DC | PRN

## 2013-08-12 MED ORDER — ONDANSETRON HCL 4 MG/2ML IJ SOLN: 4.0000 mg | INTRAMUSCULAR | Status: DC | PRN

## 2013-08-12 MED ORDER — LIDOCAINE HCL (PF) 1 % IJ SOLN
INTRAMUSCULAR | Status: DC | PRN
  Administered 2013-08-12 (×2): 5 mL
  Administered 2013-08-12: 3 mL

## 2013-08-12 MED ORDER — IBUPROFEN 600 MG PO TABS
600.0000 mg | ORAL_TABLET | Freq: Four times a day (QID) | ORAL | Status: DC
  Administered 2013-08-12 – 2013-08-14 (×8): 600 mg via ORAL
  Filled 2013-08-12 (×7): qty 1

## 2013-08-12 MED ORDER — SIMETHICONE 80 MG PO CHEW: 80.0000 mg | CHEWABLE_TABLET | ORAL | Status: DC | PRN

## 2013-08-12 MED ORDER — FENTANYL 2.5 MCG/ML BUPIVACAINE 1/10 % EPIDURAL INFUSION (WH - ANES)
INTRAMUSCULAR | Status: DC | PRN
  Administered 2013-08-12: 14 mL/h via EPIDURAL

## 2013-08-12 MED ORDER — LANOLIN HYDROUS EX OINT: TOPICAL_OINTMENT | CUTANEOUS | Status: DC | PRN

## 2013-08-12 MED ORDER — SENNOSIDES-DOCUSATE SODIUM 8.6-50 MG PO TABS
2.0000 | ORAL_TABLET | ORAL | Status: DC
  Administered 2013-08-13 (×2): 2 via ORAL
  Filled 2013-08-12 (×2): qty 2

## 2013-08-12 MED ORDER — OXYTOCIN 40 UNITS IN LACTATED RINGERS INFUSION - SIMPLE MED: 1.0000 m[IU]/min | INTRAVENOUS | Status: DC

## 2013-08-12 MED ORDER — DIPHENHYDRAMINE HCL 25 MG PO CAPS: 25.0000 mg | ORAL_CAPSULE | Freq: Four times a day (QID) | ORAL | Status: DC | PRN

## 2013-08-12 NOTE — Anesthesia Procedure Notes (Signed)
Epidural Patient location during procedure: OB  Staffing Anesthesiologist: Trestan Vahle Performed by: anesthesiologist   Preanesthetic Checklist Completed: patient identified, site marked, surgical consent, pre-op evaluation, timeout performed, IV checked, risks and benefits discussed and monitors and equipment checked  Epidural Patient position: sitting Prep: ChloraPrep Patient monitoring: heart rate, continuous pulse ox and blood pressure Approach: midline Location: L3-L4 Injection technique: LOR saline  Needle:  Needle type: Tuohy  Needle gauge: 17 G Needle length: 9 cm and 9 Needle insertion depth: 5 cm Catheter type: closed end flexible Catheter size: 20 Guage Catheter at skin depth: 10 cm Test dose: negative  Assessment Events: blood not aspirated, injection not painful, no injection resistance, negative IV test and no paresthesia  Additional Notes   Patient tolerated the insertion well without complications.   

## 2013-08-12 NOTE — Progress Notes (Signed)
  Subjective: Pt is comfortable with epidural, noting some pressure.  Family at bedside and supportive.  Objective: BP 121/64  Pulse 98  Temp(Src) 98.5 F (36.9 C) (Oral)  Resp 18  Ht 5\' 2"  (1.575 m)  Wt 164 lb (74.39 kg)  BMI 29.99 kg/m2  SpO2 100%      FHT: Category I UC:   regular, every 3-4 minutes  SVE:   Dilation: Lip/rim Effacement (%): 90 Station: +1 Exam by:: Smith   Augmentation of labor, progressing well  Labor: Progressing normally, Pitocin 5 miliU, MVUs 150 Preeclampsia: BPs WNL  Fetal Wellbeing: Category I  Pain Control: Epidural  I/D: GBS pos; PCN prophlaxis; SROM at 1730 yesterday; Afebrile  Anticipated MOD: NSVD    Samantha Dillon 04/28/2013, 7:23 AM

## 2013-08-12 NOTE — Anesthesia Preprocedure Evaluation (Signed)

## 2013-08-12 NOTE — Progress Notes (Signed)
  Subjective: Pt is comfortable with epidural.  Family at bedside and supportive.  Objective: BP 112/61  Pulse 98  Temp(Src) 97.8 F (36.6 C) (Oral)  Resp 18  Ht 5\' 2"  (1.575 m)  Wt 164 lb (74.39 kg)  BMI 29.99 kg/m2  SpO2 100%      FHT: Category I UC:   regular, every 4-5 minutes  SVE:   Dilation: 6.5 Effacement (%): 90 Station: 0 Exam by:: [name]   Augmentation of labor, progressing well  Labor: Progressing normally, Pitocin at 5 miliU, MVUs 195 Preeclampsia: BPs WNL  Fetal Wellbeing: Category I  Pain Control: Epidural  I/D: GBS pos; PCN prophlaxis; SROM at 1730 yesterday; Afebrile    Anticipated MOD: NSVD    Samantha Dillon 04/28/2013, 7:23 AM

## 2013-08-12 NOTE — Progress Notes (Signed)
Patient ID: Samantha Dillon, female   DOB: 09/06/1990, 23 y.o.   MRN: [id number] Samantha Dillon is a 23 y.o. G1P0000 at 7272w6d admitted for PROM   Subjective: Denies need for pain meds   Objective: BP 111/64  Pulse 70  Temp(Src) 98.1 F (36.7 C) (Oral)  Resp 18  Ht 5\' 2"  (1.575 m)  Wt 164 lb (74.39 kg)  BMI 29.99 kg/m2     FHT:  Cat 1 UC:   toco irreg, some coupling, 2--5  SVE:   Dilation: 2.5 Effacement (%): 80 Station: -1 Exam by:: Samantha Dillon, CNM  Exam deferred   Assessment / Plan:  Labor: PROM x10hrs, on pitocin  Preeclampsia:  no s/s Fetal Wellbeing:  Category I Pain Control:  Labor support without medications Anticipated MOD:  NSVD  GBS pos, rcv'd PCN Continue pitocin, will recheck cervix after epidural, consider IUPC    Update physician PRN   Dysen Edmondson M 08/12/2013, 4:16 AM

## 2013-08-12 NOTE — Progress Notes (Signed)
  Subjective: Pt is comfortable with epidural.  Family at bedside and supportive.  Between 0600-0700 FHT became Cat II likely d/t tachysystole Pt was given a bolus, Pitocin was lower to half, position changes were done, and an IUPC was placed Cat II FHT continued, pitocin was turned off and FHT quickly resolved to cat I  Objective: BP 116/55  Pulse 89  Temp(Src) 98 F (36.7 C) (Axillary)  Resp 18  Ht 5\' 2"  (1.575 m)  Wt 164 lb (74.39 kg)  BMI 29.99 kg/m2  SpO2 100%      FHT: Category I UC:   regular, every 3-9 minutes  SVE:   Deferred   Augmentation of labor, progressing well  Labor: After 1 hour of Cat I tracing pitocin is being restarted at 1-1, MVUs now at 155 Preeclampsia: BPs WNL  Fetal Wellbeing: Category I  Pain Control: Epidural  I/D: GBS pos; PCN prophlaxis; SROM at 1730 yesterday; Afebrile  Anticipated MOD: NSVD    Norita Meigs 04/28/2013, 7:23 AM

## 2013-08-13 LAB — CBC
HEMATOCRIT: 28.5 % — AB (ref 36.0–46.0)
Hemoglobin: 9.3 g/dL — ABNORMAL LOW (ref 12.0–15.0)
MCH: 28.4 pg (ref 26.0–34.0)
MCHC: 32.6 g/dL (ref 30.0–36.0)
MCV: 86.9 fL (ref 78.0–100.0)
Platelets: 146 10*3/uL — ABNORMAL LOW (ref 150–400)
RBC: 3.28 MIL/uL — ABNORMAL LOW (ref 3.87–5.11)
RDW: 13.6 % (ref 11.5–15.5)
WBC: 11.7 10*3/uL — ABNORMAL HIGH (ref 4.0–10.5)

## 2013-08-13 MED ORDER — MEDROXYPROGESTERONE ACETATE 150 MG/ML IM SUSP
150.0000 mg | Freq: Once | INTRAMUSCULAR | Status: AC
  Administered 2013-08-14: 150 mg via INTRAMUSCULAR
  Filled 2013-08-13: qty 1

## 2013-08-13 MED ORDER — FERROUS SULFATE 325 (65 FE) MG PO TABS
325.0000 mg | ORAL_TABLET | Freq: Two times a day (BID) | ORAL | Status: DC
  Administered 2013-08-13 – 2013-08-14 (×2): 325 mg via ORAL
  Filled 2013-08-13 (×2): qty 1

## 2013-08-13 MED ORDER — MEDROXYPROGESTERONE ACETATE 150 MG/ML IM SUSP
150.0000 mg | Freq: Once | INTRAMUSCULAR | Status: DC
Start: 2013-08-14 — End: 2013-08-13

## 2013-08-13 NOTE — Anesthesia Postprocedure Evaluation (Signed)
Anesthesia Post Note  Patient: Basilio CairoWhitney S Mertz  Procedure(s) Performed: * No procedures listed *  Anesthesia type: Epidural  Patient location: Mother/Baby  Post pain: Pain level controlled  Post assessment: Post-op Vital signs reviewed  Last Vitals:  Filed Vitals:   08/13/13 0300  BP: 101/59  Pulse: 89  Temp: 36.6 C  Resp: 18    Post vital signs: Reviewed  Level of consciousness:alert  Complications: No apparent anesthesia complications

## 2013-08-13 NOTE — Progress Notes (Signed)
Subjective: Postpartum Day 1: Vaginal delivery, left sided vaginal laceration. Patient up ad lib, reports no syncope or dizziness. Feeding:  Bottle. Contraceptive plan:  Depo. +flatus, no BM. Tolerating regular diet, voiding and ambulating w/o event. Pain well controlled.  Female "Saundra ShellingZaiden" will have outpatient circ.   Objective: Vital signs in last 24 hours: Temp:  [97.7 F (36.5 C)-98.5 F (36.9 C)] 97.9 F (36.6 C) (07/20 0300) Pulse Rate:  [85-118] 89 (07/20 0300) Resp:  [18-20] 18 (07/20 0300) BP: (86-130)/(43-91) 101/59 mmHg (07/20 0300) SpO2:  [99 %-100 %] 100 % (07/20 0300)  Physical Exam:  General: alert and cooperative Lochia: appropriate Uterine Fundus: firm Perineum: healing well DVT Evaluation: No evidence of DVT seen on physical exam. Negative Homan's sign.    Recent Labs  08/11/13 1900 08/13/13 0545  HGB 10.9* 9.3*  HCT 33.0* 28.5*    Assessment/Plan: Status post vaginal delivery day 1. Anemia w/o hemodynamic instability. Reviewed indications for blood transfusion; not indicated at this time, but will start twice daily Iron. Mild thrombocytopenia - plts 146; pt asymptomatic GBS positive - adequately treated Continue current care. Plan for discharge tomorrow if clinically stable. Depo before discharge.    Robyne AskewWILLIAMS, KIMBERLYCNM 08/13/2013, 9:25 AM

## 2013-08-13 NOTE — Progress Notes (Signed)
UR chart review completed.  

## 2013-08-14 DIAGNOSIS — D696 Thrombocytopenia, unspecified: Secondary | ICD-10-CM | POA: Insufficient documentation

## 2013-08-14 DIAGNOSIS — D649 Anemia, unspecified: Secondary | ICD-10-CM | POA: Insufficient documentation

## 2013-08-14 LAB — RUBELLA SCREEN: RUBELLA: 5.56 {index} — AB (ref <0.90)

## 2013-08-14 MED ORDER — DOCUSATE SODIUM 100 MG PO CAPS: 100.0000 mg | ORAL_CAPSULE | Freq: Two times a day (BID) | ORAL | Status: AC

## 2013-08-14 MED ORDER — IBUPROFEN 600 MG PO TABS: 600.0000 mg | ORAL_TABLET | Freq: Four times a day (QID) | ORAL | Status: AC | PRN

## 2013-08-14 MED ORDER — FERROUS SULFATE 325 (65 FE) MG PO TABS: 325.0000 mg | ORAL_TABLET | Freq: Two times a day (BID) | ORAL | Status: AC

## 2013-08-14 MED ORDER — MEDROXYPROGESTERONE ACETATE 150 MG/ML IM SUSP: 150.0000 mg | Freq: Once | INTRAMUSCULAR | Status: AC

## 2013-08-14 NOTE — Discharge Instructions (Signed)
-Contraception Choices Contraception (birth control) is the use of any methods or devices to prevent pregnancy. Below are some methods to help avoid pregnancy. HORMONAL METHODS   Contraceptive implant. This is a thin, plastic tube containing progesterone hormone. It does not contain estrogen hormone. Your health care provider inserts the tube in the inner part of the upper arm. The tube can remain in place for up to 3 years. After 3 years, the implant must be removed. The implant prevents the ovaries from releasing an egg (ovulation), thickens the cervical mucus to prevent sperm from entering the uterus, and thins the lining of the inside of the uterus.  Progesterone-only injections. These injections are given every 3 months by your health care provider to prevent pregnancy. This synthetic progesterone hormone stops the ovaries from releasing eggs. It also thickens cervical mucus and changes the uterine lining. This makes it harder for sperm to survive in the uterus.  Birth control pills. These pills contain estrogen and progesterone hormone. They work by preventing the ovaries from releasing eggs (ovulation). They also cause the cervical mucus to thicken, preventing the sperm from entering the uterus. Birth control pills are prescribed by a health care provider.Birth control pills can also be used to treat heavy periods.  Minipill. This type of birth control pill contains only the progesterone hormone. They are taken every day of each month and must be prescribed by your health care provider.  Birth control patch. The patch contains hormones similar to those in birth control pills. It must be changed once a week and is prescribed by a health care provider.  Vaginal ring. The ring contains hormones similar to those in birth control pills. It is left in the vagina for 3 weeks, removed for 1 week, and then a new one is put back in place. The patient must be comfortable inserting and removing the ring  from the vagina.A health care provider's prescription is necessary.  Emergency contraception. Emergency contraceptives prevent pregnancy after unprotected sexual intercourse. This pill can be taken right after sex or up to 5 days after unprotected sex. It is most effective the sooner you take the pills after having sexual intercourse. Most emergency contraceptive pills are available without a prescription. Check with your pharmacist. Do not use emergency contraception as your only form of birth control. BARRIER METHODS   Female condom. This is a thin sheath (latex or rubber) that is worn over the penis during sexual intercourse. It can be used with spermicide to increase effectiveness.  Female condom. This is a soft, loose-fitting sheath that is put into the vagina before sexual intercourse.  Diaphragm. This is a soft, latex, dome-shaped barrier that must be fitted by a health care provider. It is inserted into the vagina, along with a spermicidal jelly. It is inserted before intercourse. The diaphragm should be left in the vagina for 6 to 8 hours after intercourse.  Cervical cap. This is a round, soft, latex or plastic cup that fits over the cervix and must be fitted by a health care provider. The cap can be left in place for up to 48 hours after intercourse.  Sponge. This is a soft, circular piece of polyurethane foam. The sponge has spermicide in it. It is inserted into the vagina after wetting it and before sexual intercourse.  Spermicides. These are chemicals that kill or block sperm from entering the cervix and uterus. They come in the form of creams, jellies, suppositories, foam, or tablets. They do not require a  prescription. They are inserted into the vagina with an applicator before having sexual intercourse. The process must be repeated every time you have sexual intercourse. INTRAUTERINE CONTRACEPTION  Intrauterine device (IUD). This is a T-shaped device that is put in a woman's uterus  during a menstrual period to prevent pregnancy. There are 2 types:  Copper IUD. This type of IUD is wrapped in copper wire and is placed inside the uterus. Copper makes the uterus and fallopian tubes produce a fluid that kills sperm. It can stay in place for 10 years.  Hormone IUD. This type of IUD contains the hormone progestin (synthetic progesterone). The hormone thickens the cervical mucus and prevents sperm from entering the uterus, and it also thins the uterine lining to prevent implantation of a fertilized egg. The hormone can weaken or kill the sperm that get into the uterus. It can stay in place for 3-5 years, depending on which type of IUD is used. PERMANENT METHODS OF CONTRACEPTION  Female tubal ligation. This is when the woman's fallopian tubes are surgically sealed, tied, or blocked to prevent the egg from traveling to the uterus.  Hysteroscopic sterilization. This involves placing a small coil or insert into each fallopian tube. Your doctor uses a technique called hysteroscopy to do the procedure. The device causes scar tissue to form. This results in permanent blockage of the fallopian tubes, so the sperm cannot fertilize the egg. It takes about 3 months after the procedure for the tubes to become blocked. You must use another form of birth control for these 3 months.  Female sterilization. This is when the female has the tubes that carry sperm tied off (vasectomy).This blocks sperm from entering the vagina during sexual intercourse. After the procedure, the man can still ejaculate fluid (semen). NATURAL PLANNING METHODS  Natural family planning. This is not having sexual intercourse or using a barrier method (condom, diaphragm, cervical cap) on days the woman could become pregnant.  Calendar method. This is keeping track of the length of each menstrual cycle and identifying when you are fertile.  Ovulation method. This is avoiding sexual intercourse during ovulation.  Symptothermal  method. This is avoiding sexual intercourse during ovulation, using a thermometer and ovulation symptoms.  Post-ovulation method. This is timing sexual intercourse after you have ovulated. Regardless of which type or method of contraception you choose, it is important that you use condoms to protect against the transmission of sexually transmitted infections (STIs). Talk with your health care provider about which form of contraception is most appropriate for you. Document Released: 01/11/2005 Document Revised: 01/16/2013 Document Reviewed: 07/06/2012 [facility] Patient Information 2015 [place], [place]. This information is not intended to replace advice given to you by your health care provider. Make sure you discuss any questions you have with your health care provider. Anemia, Nonspecific Anemia is a condition in which the concentration of red blood cells or hemoglobin in the blood is below normal. Hemoglobin is a substance in red blood cells that carries oxygen to the tissues of the body. Anemia results in not enough oxygen reaching these tissues.  CAUSES  Common causes of anemia include:   Excessive bleeding. Bleeding may be internal or external. This includes excessive bleeding from periods (in women) or from the intestine.   Poor nutrition.   Chronic kidney, thyroid, and liver disease.  Bone marrow disorders that decrease red blood cell production.  Cancer and treatments for cancer.  HIV, AIDS, and their treatments.  Spleen problems that increase red blood cell destruction.  Blood disorders.  Excess destruction of red blood cells due to infection, medicines, and autoimmune disorders. SIGNS AND SYMPTOMS   Minor weakness.   Dizziness.   Headache.  Palpitations.   Shortness of breath, especially with exercise.   Paleness.  Cold sensitivity.  Indigestion.  Nausea.  Difficulty sleeping.  Difficulty concentrating. Symptoms may occur suddenly or they may develop  slowly.  DIAGNOSIS  Additional blood tests are often needed. These help your health care provider determine the best treatment. Your health care provider will check your stool for blood and look for other causes of blood loss.  TREATMENT  Treatment varies depending on the cause of the anemia. Treatment can include:   Supplements of iron, vitamin J18, or folic acid.   Hormone medicines.   A blood transfusion. This may be needed if blood loss is severe.   Hospitalization. This may be needed if there is significant continual blood loss.   Dietary changes.  Spleen removal. HOME CARE INSTRUCTIONS Keep all follow-up appointments. It often takes many weeks to correct anemia, and having your health care provider check on your condition and your response to treatment is very important. SEEK IMMEDIATE MEDICAL CARE IF:   You develop extreme weakness, shortness of breath, or chest pain.   You become dizzy or have trouble concentrating.  You develop heavy vaginal bleeding.   You develop a rash.   You have bloody or black, tarry stools.   You faint.   You vomit up blood.   You vomit repeatedly.   You have abdominal pain.  You have a fever or persistent symptoms for more than 2-3 days.   You have a fever and your symptoms suddenly get worse.   You are dehydrated.  MAKE SURE YOU:  Understand these instructions.  Will watch your condition.  Will get help right away if you are not doing well or get worse. Document Released: 02/19/2004 Document Revised: 09/13/2012 Document Reviewed: 07/07/2012 [facility] Patient Information 2015 [place], [place]. This information is not intended to replace advice given to you by your health care provider. Make sure you discuss any questions you have with your health care provider. Postpartum Depression and Baby Blues The postpartum period begins right after the birth of a baby. During this time, there is often a great amount of joy and  excitement. It is also a time of many changes in the life of the parents. Regardless of how many times a mother gives birth, each child brings new challenges and dynamics to the family. It is not unusual to have feelings of excitement along with confusing shifts in moods, emotions, and thoughts. All mothers are at risk of developing postpartum depression or the "baby blues." These mood changes can occur right after giving birth, or they may occur many months after giving birth. The baby blues or postpartum depression can be mild or severe. Additionally, postpartum depression can go away rather quickly, or it can be a long-term condition.  CAUSES Raised hormone levels and the rapid drop in those levels are thought to be a main cause of postpartum depression and the baby blues. A number of hormones change during and after pregnancy. Estrogen and progesterone usually decrease right after the delivery of your baby. The levels of thyroid hormone and various cortisol steroids also rapidly drop. Other factors that play a role in these mood changes include major life events and genetics.  RISK FACTORS If you have any of the following risks for the baby blues or postpartum depression,  know what symptoms to watch out for during the postpartum period. Risk factors that may increase the likelihood of getting the baby blues or postpartum depression include:  Having a personal or family history of depression.   Having depression while being pregnant.   Having premenstrual mood issues or mood issues related to oral contraceptives.  Having a lot of life stress.   Having marital conflict.   Lacking a social support network.   Having a baby with special needs.   Having health problems, such as diabetes.  SIGNS AND SYMPTOMS Symptoms of baby blues include:  Brief changes in mood, such as going from extreme happiness to sadness.  Decreased concentration.   Difficulty sleeping.   Crying spells,  tearfulness.   Irritability.   Anxiety.  Symptoms of postpartum depression typically begin within the first month after giving birth. These symptoms include:  Difficulty sleeping or excessive sleepiness.   Marked weight loss.   Agitation.   Feelings of worthlessness.   Lack of interest in activity or food.  Postpartum psychosis is a very serious condition and can be dangerous. Fortunately, it is rare. Displaying any of the following symptoms is cause for immediate medical attention. Symptoms of postpartum psychosis include:   Hallucinations and delusions.   Bizarre or disorganized behavior.   Confusion or disorientation.  DIAGNOSIS  A diagnosis is made by an evaluation of your symptoms. There are no medical or lab tests that lead to a diagnosis, but there are various questionnaires that a health care provider may use to identify those with the baby blues, postpartum depression, or psychosis. Often, a screening tool called the [place] Postnatal Depression Scale is used to diagnose depression in the postpartum period.  TREATMENT The baby blues usually goes away on its own in 1-2 weeks. Social support is often all that is needed. You will be encouraged to get adequate sleep and rest. Occasionally, you may be given medicines to help you sleep.  Postpartum depression requires treatment because it can last several months or longer if it is not treated. Treatment may include individual or group therapy, medicine, or both to address any social, physiological, and psychological factors that may play a role in the depression. Regular exercise, a healthy diet, rest, and social support may also be strongly recommended.  Postpartum psychosis is more serious and needs treatment right away. Hospitalization is often needed. HOME CARE INSTRUCTIONS  Get as much rest as you can. Nap when the baby sleeps.   Exercise regularly. Some women find yoga and walking to be beneficial.   Eat a  balanced and nourishing diet.   Do little things that you enjoy. Have a cup of tea, take a bubble bath, read your favorite magazine, or listen to your favorite music.  Avoid alcohol.   Ask for help with household chores, cooking, grocery shopping, or running errands as needed. Do not try to do everything.   Talk to people close to you about how you are feeling. Get support from your partner, family members, friends, or other new moms.  Try to stay positive in how you think. Think about the things you are grateful for.   Do not spend a lot of time alone.   Only take over-the-counter or prescription medicine as directed by your health care provider.  Keep all your postpartum appointments.   Let your health care provider know if you have any concerns.  SEEK MEDICAL CARE IF: You are having a reaction to or problems with your medicine.  SEEK IMMEDIATE MEDICAL CARE IF:  You have suicidal feelings.   You think you may harm the baby or someone else. MAKE SURE YOU:  Understand these instructions.  Will watch your condition.  Will get help right away if you are not doing well or get worse. Document Released: 10/16/2003 Document Revised: 01/16/2013 Document Reviewed: 10/23/2012 [facility] Patient Information 2015 [place], [place]. This information is not intended to replace advice given to you by your health care provider. Make sure you discuss any questions you have with your health care provider. Postpartum Care After Vaginal Delivery After you deliver your newborn (postpartum period), the usual stay in the hospital is 24-72 hours. If there were problems with your labor or delivery, or if you have other medical problems, you might be in the hospital longer.  While you are in the hospital, you will receive help and instructions on how to care for yourself and your newborn during the postpartum period.  While you are in the hospital:  Be sure to tell your nurses if you have pain or  discomfort, as well as where you feel the pain and what makes the pain worse.  If you had an incision made near your vagina (episiotomy) or if you had some tearing during delivery, the nurses may put ice packs on your episiotomy or tear. The ice packs may help to reduce the pain and swelling.  If you are breastfeeding, you may feel uncomfortable contractions of your uterus for a couple of weeks. This is normal. The contractions help your uterus get back to normal size.  It is normal to have some bleeding after delivery.  For the first 1-3 days after delivery, the flow is red and the amount may be similar to a period.  It is common for the flow to start and stop.  In the first few days, you may pass some small clots. Let your nurses know if you begin to pass large clots or your flow increases.  Do not  flush blood clots down the toilet before having the nurse look at them.  During the next 3-10 days after delivery, your flow should become more watery and pink or brown-tinged in color.  Ten to fourteen days after delivery, your flow should be a small amount of yellowish-white discharge.  The amount of your flow will decrease over the first few weeks after delivery. Your flow may stop in 6-8 weeks. Most women have had their flow stop by 12 weeks after delivery.  You should change your sanitary pads frequently.  Wash your hands thoroughly with soap and water for at least 20 seconds after changing pads, using the toilet, or before holding or feeding your newborn.  You should feel like you need to empty your bladder within the first 6-8 hours after delivery.  In case you become weak, lightheaded, or faint, call your nurse before you get out of bed for the first time and before you take a shower for the first time.  Within the first few days after delivery, your breasts may begin to feel tender and full. This is called engorgement. Breast tenderness usually goes away within 48-72 hours after  engorgement occurs. You may also notice milk leaking from your breasts. If you are not breastfeeding, do not stimulate your breasts. Breast stimulation can make your breasts produce more milk.  Spending as much time as possible with your newborn is very important. During this time, you and your newborn can feel close and get to know  each other. Having your newborn stay in your room (rooming in) will help to strengthen the bond with your newborn. It will give you time to get to know your newborn and become comfortable caring for your newborn.  Your hormones change after delivery. Sometimes the hormone changes can temporarily cause you to feel sad or tearful. These feelings should not last more than a few days. If these feelings last longer than that, you should talk to your caregiver.  If desired, talk to your caregiver about methods of family planning or contraception.  Talk to your caregiver about immunizations. Your caregiver may want you to have the following immunizations before leaving the hospital:  Tetanus, diphtheria, and pertussis (Tdap) or tetanus and diphtheria (Td) immunization. It is very important that you and your family (including grandparents) or others caring for your newborn are up-to-date with the Tdap or Td immunizations. The Tdap or Td immunization can help protect your newborn from getting ill.  Rubella immunization.  Varicella (chickenpox) immunization.  Influenza immunization. You should receive this annual immunization if you did not receive the immunization during your pregnancy. Document Released: 11/08/2006 Document Revised: 10/06/2011 Document Reviewed: 09/08/2011 [facility] Patient Information 2015 [place], [place]. This information is not intended to replace advice given to you by your health care provider. Make sure you discuss any questions you have with your health care provider.

## 2013-08-14 NOTE — Discharge Summary (Signed)
Vaginal Delivery Discharge Summary  Samantha Dillon  DOB:    05/15/1990 MRN:    [id number] CSN:    [id number]  Date of admission:                  08/11/13  Date of discharge:                   08/14/13  Procedures this admission:   SVD  Date of Delivery: 08/12/13  Newborn Data:  Live born female  Birth Weight: 7 lb 2.5 oz (3245 g) APGAR: 8, 9  Home with mother. Name: Samantha Dillon Plan: Outpatient  History of Present Illness:  [name] is a 23 y.o. female, G1P1001, who presents at 6529w6d weeks gestation. [facility] She was admitted rupture of membranes. Her pregnancy has been complicated by:  Patient Active Problem List   Diagnosis Date Noted  . Thrombocytopenia, unspecified - will need CBC at 6-wk pp visit 08/14/2013  . Anemia 08/14/2013  . Bacterial vaginosis 07/23/2013  . GBS bacteriuria 07/23/2013  . Chlamydia, pos 10/2012, tested neg 01/2013 11/24/2012  . Headache      Hospital Course:  The patient was admitted for premature rupture of membranes. Positive GBS; adequately treated. Utilized epidural for pain management.  Delivery was performed by Haroldine LawsJennifer Oxley, CNM without complication. Patient and baby tolerated the procedure without difficulty, with left sided vaginal laceration noted. Infant status was good and remained in room with mother.  Mother and infant then had an uncomplicated postpartum course, with bottlefeeding going well. Mom's physical exam was WNL, and she was discharged home in stable condition. Contraception plan was Depo. She received her initial injection prior to discharge. Reviewed side effects of Depo in detail and when to return to office for subsequent injections.  She received adequate benefit from po pain medications.   Of note, platelets 146 on day of discharge. Will need CBC at 6-wk pp visit. Pt. Asymptomatic.  Started  on Fe bid on postpartum day 1. Pt. To continue taking throughout postpartum course.   Feeding:  bottle  Contraception:  Depo-Provera  Discharge hemoglobin:  Hemoglobin  Date Value Ref Range Status  08/13/2013 9.3* 12.0 - 15.0 g/dL Final     HCT  Date Value Ref Range Status  08/13/2013 28.5* 36.0 - 46.0 % Final    Discharge Physical Exam:   General: alert, cooperative, fatigued and no distress Lungs: CTAB CV: RRR Lochia: appropriate Uterine Fundus: firm Incision: healing well DVT Evaluation: No evidence of DVT seen on physical exam. Negative Homan's sign.  Intrapartum Procedures: spontaneous vaginal delivery Postpartum Procedures: none Complications-Operative and Postpartum: Left sided vaginal laceration (repaired)  Discharge Diagnoses: Term Pregnancy-delivered, Anemia, mild thrombocytopenia  Discharge Information:  Activity:           pelvic rest Diet:                routine Medications: PNV, Ibuprofen, Colace and Iron Condition:      stable    Discharge to: home  Follow-up Information   [facility]. Schedule an appointment as soon as possible for a visit in 6 weeks. (Per [facility] handout. Call sooner with questions or concerns.)    Specialty:  Obstetrics and Gynecology   Contact information:   [address] [place] [id number] [phone]       Mayford KnifeWILLIAMS,  KIMBERLY CNM 08/14/2013 9:04 AM  B Pos. Rubella Immune.

## 2013-11-26 ENCOUNTER — Encounter (HOSPITAL_COMMUNITY): Payer: Self-pay

## 2016-09-10 ENCOUNTER — Emergency Department (HOSPITAL_COMMUNITY)
Admission: EM | Admit: 2016-09-10 | Discharge: 2016-09-10 | Disposition: A | Discharge status: Home / Self Care | Payer: Medicaid Other | Attending: Emergency Medicine | Admitting: Emergency Medicine

## 2016-09-10 ENCOUNTER — Emergency Department (HOSPITAL_COMMUNITY): Payer: Medicaid Other

## 2016-09-10 ENCOUNTER — Encounter (HOSPITAL_COMMUNITY): Payer: Self-pay | Admitting: Nurse Practitioner

## 2016-09-10 DIAGNOSIS — R221 Localized swelling, mass and lump, neck: Secondary | ICD-10-CM | POA: Diagnosis present

## 2016-09-10 DIAGNOSIS — K649 Unspecified hemorrhoids: Secondary | ICD-10-CM | POA: Diagnosis not present

## 2016-09-10 DIAGNOSIS — Z79899 Other long term (current) drug therapy: Secondary | ICD-10-CM | POA: Insufficient documentation

## 2016-09-10 DIAGNOSIS — M542 Cervicalgia: Secondary | ICD-10-CM | POA: Insufficient documentation

## 2016-09-10 DIAGNOSIS — F17290 Nicotine dependence, other tobacco product, uncomplicated: Secondary | ICD-10-CM | POA: Diagnosis not present

## 2016-09-10 LAB — CBC WITH DIFFERENTIAL/PLATELET
Basophils Absolute: 0 10*3/uL (ref 0.0–0.1)
Basophils Relative: 0 %
Eosinophils Absolute: 0 10*3/uL (ref 0.0–0.7)
Eosinophils Relative: 0 %
HCT: 38.3 % (ref 36.0–46.0)
Hemoglobin: 13 g/dL (ref 12.0–15.0)
Lymphocytes Relative: 40 %
Lymphs Abs: 2.4 10*3/uL (ref 0.7–4.0)
MCH: 31.4 pg (ref 26.0–34.0)
MCHC: 33.9 g/dL (ref 30.0–36.0)
MCV: 92.5 fL (ref 78.0–100.0)
MONOS PCT: 6 %
Monocytes Absolute: 0.4 10*3/uL (ref 0.1–1.0)
NEUTROS PCT: 54 %
Neutro Abs: 3.2 10*3/uL (ref 1.7–7.7)
Platelets: 200 10*3/uL (ref 150–400)
RBC: 4.14 MIL/uL (ref 3.87–5.11)
RDW: 13.3 % (ref 11.5–15.5)
WBC: 5.9 10*3/uL (ref 4.0–10.5)

## 2016-09-10 LAB — COMPREHENSIVE METABOLIC PANEL
ALT: 10 U/L — AB (ref 14–54)
AST: 12 U/L — ABNORMAL LOW (ref 15–41)
Albumin: 4.4 g/dL (ref 3.5–5.0)
Alkaline Phosphatase: 32 U/L — ABNORMAL LOW (ref 38–126)
Anion gap: 10 (ref 5–15)
BUN: 9 mg/dL (ref 6–20)
CO2: 23 mmol/L (ref 22–32)
Calcium: 9.4 mg/dL (ref 8.9–10.3)
Chloride: 105 mmol/L (ref 101–111)
Creatinine, Ser: 0.76 mg/dL (ref 0.44–1.00)
GFR calc non Af Amer: >60 mL/min (ref >60)
Glucose, Bld: 88 mg/dL (ref 65–99)
POTASSIUM: 4.3 mmol/L (ref 3.5–5.1)
Sodium: 138 mmol/L (ref 135–145)
Total Bilirubin: 1.1 mg/dL (ref 0.3–1.2)
Total Protein: 7.3 g/dL (ref 6.5–8.1)

## 2016-09-10 LAB — URINALYSIS, ROUTINE W REFLEX MICROSCOPIC
BILIRUBIN URINE: NEGATIVE
GLUCOSE, UA: NEGATIVE mg/dL
KETONES UR: NEGATIVE mg/dL
LEUKOCYTES UA: NEGATIVE
NITRITE: NEGATIVE
PH: 6 (ref 5.0–8.0)
Protein, ur: 100 mg/dL — AB
SPECIFIC GRAVITY, URINE: 1.029 (ref 1.005–1.030)

## 2016-09-10 LAB — TSH: TSH: 0.758 u[IU]/mL (ref 0.350–4.500)

## 2016-09-10 LAB — PREGNANCY, URINE: PREG TEST UR: NEGATIVE

## 2016-09-10 MED ORDER — IOPAMIDOL (ISOVUE-300) INJECTION 61%
INTRAVENOUS | Status: AC
  Administered 2016-09-10: 75 mL
  Filled 2016-09-10: qty 75

## 2016-09-10 NOTE — ED Provider Notes (Signed)
[facility]-EMERGENCY DEPT Provider Note   CSN: [id number] Arrival date & time: 09/10/16  1421  By signing my name below, I, Deland Pretty, attest that this documentation has been prepared under the direction and in the presence of Meha Vidrine K. Yasemin Rabon, PA-C. Electronically Signed: Deland Pretty, ED Scribe. 09/10/16. 5:43 PM.  History   Chief Complaint Chief Complaint  Patient presents with  . Hemorrhoids  . Lymphadenopathy   The history is provided by the patient. No language interpreter was used.   HPI Comments: Samantha Dillon is a 26 y.o. female who presents to the Emergency Department complaining of a gradual onset of a persistent "lump" in her anterior central neck with associated appetite change (eating once a week) that began 2 months ago. She states that she is not hungry. The pt also complains of persistent hemorrhoids that began 3 years ago after the birth of her son. She has tried prescribed hemorrhoid ointments for her pain with inadequate relief. The pt states that she smokes tobacco. She also states that she has a FMHx of thyroid cancer (mother) that was treated in 2008. The pt denies chills, and fever.  Past Medical History:  Diagnosis Date  . Headache(784.0)   . Heart murmur    As a child    Patient Active Problem List   Diagnosis Date Noted  . Thrombocytopenia, unspecified - will need CBC at 6-wk pp visit 08/14/2013  . Anemia 08/14/2013  . NSVD (normal spontaneous vaginal delivery) 08/12/2013  . Bacterial vaginosis 07/23/2013  . GBS bacteriuria 07/23/2013  . Chlamydia, pos 10/2012, tested neg 01/2013 11/24/2012  . OTLXBWIO(035.5)     Past Surgical History:  Procedure Laterality Date  . adnoids    . CYST REMOVAL LEG     right back thigh  . TYMPANOSTOMY TUBE PLACEMENT    . WISDOM TOOTH EXTRACTION      OB History    Gravida Para Term Preterm AB Living   1 1 1  0 0 1   SAB TAB Ectopic Multiple Live Births   0 0 0 0 1       Home Medications    Prior to  Admission medications   Medication Sig Start Date End Date Taking? Authorizing Provider  docusate sodium (COLACE) 100 MG capsule Take 1 capsule (100 mg total) by mouth 2 (two) times daily. 08/14/13   Sherre Scarlet, CNM  ferrous sulfate 325 (65 FE) MG tablet Take 1 tablet (325 mg total) by mouth 2 (two) times daily with a meal. 08/14/13   Sherre Scarlet, CNM  ibuprofen (ADVIL,MOTRIN) 600 MG tablet Take 1 tablet (600 mg total) by mouth every 6 (six) hours as needed for fever, headache, moderate pain or cramping. 08/14/13   Sherre Scarlet, CNM  medroxyPROGESTERone (DEPO-PROVERA) 150 MG/ML injection Inject 1 mL (150 mg total) into the muscle once. 08/14/13   Sherre Scarlet, CNM  Prenatal Vit-Fe Fumarate-FA (PRENATAL MULTIVITAMIN) TABS tablet Take 1 tablet by mouth daily.     [provider]    Family History Family History  Problem Relation Age of Onset  . Thyroid disease Mother   . Migraines Brother   . Hypertension Maternal Grandmother   . Asthma Maternal Grandmother     Social History Social History  Substance Use Topics  . Smoking status: Current Every Day Smoker    Types: Cigars  . Smokeless tobacco: Never Used  . Alcohol use No     Allergies   Patient has no known allergies.   Review of Systems  Review of Systems  Constitutional: Positive for appetite change. Negative for chills and fever.  HENT: Positive for facial swelling (neck).      Physical Exam Updated Vital Signs BP 102/63 (BP Location: Left Arm)   Pulse 72   Temp 98.4 F (36.9 C) (Oral)   Resp 16   SpO2 100%   Physical Exam  Constitutional: She is oriented to person, place, and time. She appears well-developed and well-nourished.  HENT:  Head: Normocephalic.  Eyes: EOM are normal.  Neck: Normal range of motion.  Tender lower neck. Full ROM. No lymph adenopathy.  Pulmonary/Chest: Effort normal.  Abdominal: She exhibits no distension.  Musculoskeletal: Normal range of motion.    Lymphadenopathy:    She has no cervical adenopathy.  Neurological: She is alert and oriented to person, place, and time.  Psychiatric: She has a normal mood and affect.  Nursing note and vitals reviewed.    ED Treatments / Results   DIAGNOSTIC STUDIES: Oxygen Saturation is 100% on RA, normal by my interpretation.   COORDINATION OF CARE: 5:26 PM-Discussed next steps with pt. Pt verbalized understanding and is agreeable with the plan.   Labs (all labs ordered are listed, but only abnormal results are displayed) Labs Reviewed - No data to display  EKG  EKG Interpretation None       Radiology No results found.  Procedures Procedures (including critical care time)  Medications Ordered in ED Medications - No data to display   Initial Impression / Assessment and Plan / ED Course  I have reviewed the triage vital signs and the nursing notes.  Pertinent labs & imaging results that were available during my care of the patient were reviewed by me and considered in my medical decision making (see chart for details).     Labs are normal.  TSH is normal.  Ct scan with contrast shows normal anatomy.   I counseled pt and Mother.  I advised follow up at [facility] for evaluation.  Pt advised to stop smoking.    Final Clinical Impressions(s) / ED Diagnoses   Final diagnoses:  Neck pain  Hemorrhoids, unspecified hemorrhoid type    New Prescriptions New Prescriptions   No medications on file   An After Visit Summary was printed and given to the patient.    Elson Areas, PA-C 09/10/16 [id number]    Geoffery Lyons, MD 09/10/16 [phone]

## 2016-09-10 NOTE — ED Triage Notes (Signed)
Pt presents with two complaints. She c/o hemorrhoids. The hemorrhoids have been presents for about three years. She has tried multiple treatments for the hemorrhoids with no relief. She has had increased pain with bowel movements this week. She also c/o swollen cervical lymph nodes. she's noticed the swelling over the past month. She notices the lymph nodes seem more swollen and tender to touch when she smokes cigars. shes reports increased use of cigars over the past two months

## 2016-09-10 NOTE — ED Notes (Signed)
PT states understanding of care given, follow up care. PT ambulated from ED to car with a steady gait.  

## 2017-10-31 ENCOUNTER — Encounter: Payer: Self-pay | Admitting: Obstetrics and Gynecology

## 2017-11-21 ENCOUNTER — Ambulatory Visit (INDEPENDENT_AMBULATORY_CARE_PROVIDER_SITE_OTHER): Payer: Medicaid Other | Admitting: Obstetrics and Gynecology

## 2017-11-21 ENCOUNTER — Other Ambulatory Visit (HOSPITAL_COMMUNITY)
Admission: RE | Admit: 2017-11-21 | Discharge: 2017-11-21 | Disposition: A | Discharge status: Home / Self Care | Payer: Medicaid Other | Source: Ambulatory Visit | Attending: Obstetrics and Gynecology | Admitting: Obstetrics and Gynecology

## 2017-11-21 ENCOUNTER — Encounter: Payer: Self-pay | Admitting: Obstetrics and Gynecology

## 2017-11-21 VITALS — BP 110/74 | HR 78 | Wt 120.6 lb

## 2017-11-21 DIAGNOSIS — Z23 Encounter for immunization: Secondary | ICD-10-CM | POA: Diagnosis not present

## 2017-11-21 DIAGNOSIS — N871 Moderate cervical dysplasia: Secondary | ICD-10-CM | POA: Diagnosis not present

## 2017-11-21 DIAGNOSIS — Z3202 Encounter for pregnancy test, result negative: Secondary | ICD-10-CM | POA: Diagnosis not present

## 2017-11-21 DIAGNOSIS — R87610 Atypical squamous cells of undetermined significance on cytologic smear of cervix (ASC-US): Secondary | ICD-10-CM

## 2017-11-21 DIAGNOSIS — R87810 Cervical high risk human papillomavirus (HPV) DNA test positive: Secondary | ICD-10-CM | POA: Insufficient documentation

## 2017-11-21 LAB — POCT URINE PREGNANCY: PREG TEST UR: NEGATIVE

## 2017-11-21 NOTE — Addendum Note (Signed)
Addended by: Dalphine Handing on: 11/21/2017 11:47 AM   Modules accepted: Orders

## 2017-11-21 NOTE — Progress Notes (Addendum)
Pt presents for colposcopy dx: ASCUS positive HPV.

## 2017-11-21 NOTE — Progress Notes (Signed)
27 yo here for colposcopy due to abnormal pap smear 09/2017 ASCUS + HPV  Patient given informed consent, signed copy in the chart, time out was performed.  Placed in lithotomy position. Cervix viewed with speculum and colposcope after application of acetic acid.   Colposcopy adequate?  yes Acetowhite lesions? Yes 8 and 11 o'clock Punctation? no Mosaicism?  no Abnormal vasculature?  no Biopsies? Yes 8 and 11 o'clock ECC? yes  COMMENTS:  Patient was given post procedure instructions.  She will return in 2 weeks for results. Patient offered Gardasil vaccine today  Catalina Antigua, MD

## 2017-12-28 ENCOUNTER — Other Ambulatory Visit (HOSPITAL_COMMUNITY)
Admission: RE | Admit: 2017-12-28 | Discharge: 2017-12-28 | Disposition: A | Discharge status: Home / Self Care | Payer: Medicaid Other | Source: Ambulatory Visit | Attending: Obstetrics and Gynecology | Admitting: Obstetrics and Gynecology

## 2017-12-28 ENCOUNTER — Encounter: Payer: Self-pay | Admitting: Obstetrics and Gynecology

## 2017-12-28 ENCOUNTER — Ambulatory Visit (INDEPENDENT_AMBULATORY_CARE_PROVIDER_SITE_OTHER): Payer: Medicaid Other | Admitting: Obstetrics and Gynecology

## 2017-12-28 VITALS — BP 110/71 | HR 84 | Wt 126.3 lb

## 2017-12-28 DIAGNOSIS — N871 Moderate cervical dysplasia: Secondary | ICD-10-CM | POA: Diagnosis not present

## 2017-12-28 DIAGNOSIS — Z3202 Encounter for pregnancy test, result negative: Secondary | ICD-10-CM

## 2017-12-28 DIAGNOSIS — R87810 Cervical high risk human papillomavirus (HPV) DNA test positive: Secondary | ICD-10-CM

## 2017-12-28 DIAGNOSIS — R87610 Atypical squamous cells of undetermined significance on cytologic smear of cervix (ASC-US): Secondary | ICD-10-CM

## 2017-12-28 LAB — POCT URINE PREGNANCY: PREG TEST UR: NEGATIVE

## 2017-12-28 NOTE — Progress Notes (Signed)
Pt is here for LEEP procedure.

## 2017-12-28 NOTE — Progress Notes (Signed)
Patient identified, informed consent obtained, signed copy in chart, time out performed.  Pap smear and colposcopy reviewed.   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  Cervix visualized. Paracervical block placed.  Medium size LOOP used to remove cone of cervix using blend of cut and cautery on LEEP machine.  Edges/Base cauterized with Ball.  Monsel's solution used for hemostasis.  Patient tolerated procedure well.  Patient given post procedure instructions.  Follow up in 12 months for repeat pap or as needed.

## 2018-01-04 ENCOUNTER — Telehealth: Payer: Self-pay

## 2018-01-04 NOTE — Telephone Encounter (Signed)
Spoke with patient and advised of LEEP results and repeat pap in 1 year.

## 2018-01-16 ENCOUNTER — Telehealth: Payer: Self-pay

## 2018-01-16 NOTE — Telephone Encounter (Signed)
Pt called stating that she has been experiencing bleeding since she had her LEEP procedure done 12/4. She states that she is filling up about half of a pad every 2 hours. She states that the bleeding has been consistent. Please advise.

## 2018-01-16 NOTE — Telephone Encounter (Signed)
Pt called and left message on triage line vm. Returned pt call, no answer. Left message for pt to return call to office.

## 2018-01-19 NOTE — Telephone Encounter (Signed)
Pt states that she did not have transportation to go to [facility] on 12/23. Pt states that it is not time for her cycle because she was on her cycle when she had the procedure done. She states that she is still bleeding, but has lightened up some. Pt advised again to go be evaluated at [facility]

## 2018-01-19 NOTE — Telephone Encounter (Signed)
I called pt back on 12/23 to advise pt to be evaluated at [facility]. It does not look like pt has gone. I called pt this am to follow up. Pt did not answer, left message on vm for pt to return call to the office

## 2018-01-23 ENCOUNTER — Ambulatory Visit (INDEPENDENT_AMBULATORY_CARE_PROVIDER_SITE_OTHER): Payer: Medicaid Other | Admitting: *Deleted

## 2018-01-23 VITALS — BP 103/70

## 2018-01-23 DIAGNOSIS — R87810 Cervical high risk human papillomavirus (HPV) DNA test positive: Principal | ICD-10-CM

## 2018-01-23 DIAGNOSIS — Z23 Encounter for immunization: Secondary | ICD-10-CM | POA: Diagnosis not present

## 2018-01-23 DIAGNOSIS — R87610 Atypical squamous cells of undetermined significance on cytologic smear of cervix (ASC-US): Secondary | ICD-10-CM

## 2018-01-23 NOTE — Progress Notes (Signed)
Pt is in office for 2nd Gardasil injection. Pt tolerated injection well. Pt has no other concerns today.  BP 103/70   LMP 12/25/2017 (Approximate)    Immunization History  Administered Date(s) Administered  . HPV 9-valent 11/21/2017, 01/23/2018  . Tdap 08/13/2013

## 2018-08-28 ENCOUNTER — Emergency Department (HOSPITAL_COMMUNITY)
Admission: EM | Admit: 2018-08-28 | Discharge: 2018-08-28 | Disposition: A | Discharge status: Home / Self Care | Payer: Medicaid Other | Attending: Emergency Medicine | Admitting: Emergency Medicine

## 2018-08-28 ENCOUNTER — Emergency Department (HOSPITAL_COMMUNITY): Payer: Medicaid Other

## 2018-08-28 ENCOUNTER — Encounter (HOSPITAL_COMMUNITY): Payer: Self-pay

## 2018-08-28 ENCOUNTER — Other Ambulatory Visit: Payer: Self-pay

## 2018-08-28 DIAGNOSIS — Z5321 Procedure and treatment not carried out due to patient leaving prior to being seen by health care provider: Secondary | ICD-10-CM | POA: Diagnosis not present

## 2018-08-28 DIAGNOSIS — R079 Chest pain, unspecified: Secondary | ICD-10-CM | POA: Insufficient documentation

## 2018-08-28 LAB — TROPONIN I (HIGH SENSITIVITY)
Troponin I (High Sensitivity): <2 ng/L (ref <18)
Troponin I (High Sensitivity): 2 ng/L (ref <18)

## 2018-08-28 LAB — CBC
HCT: 41 % (ref 36.0–46.0)
Hemoglobin: 13.9 g/dL (ref 12.0–15.0)
MCH: 32.3 pg (ref 26.0–34.0)
MCHC: 33.9 g/dL (ref 30.0–36.0)
MCV: 95.1 fL (ref 80.0–100.0)
Platelets: 179 10*3/uL (ref 150–400)
RBC: 4.31 MIL/uL (ref 3.87–5.11)
RDW: 12.7 % (ref 11.5–15.5)
WBC: 7.2 10*3/uL (ref 4.0–10.5)
nRBC: 0 % (ref 0.0–0.2)

## 2018-08-28 LAB — BASIC METABOLIC PANEL
Anion gap: 11 (ref 5–15)
BUN: 8 mg/dL (ref 6–20)
CO2: 24 mmol/L (ref 22–32)
Calcium: 9.1 mg/dL (ref 8.9–10.3)
Chloride: 105 mmol/L (ref 98–111)
Creatinine, Ser: 0.86 mg/dL (ref 0.44–1.00)
GFR calc Af Amer: >60 mL/min (ref >60)
GFR calc non Af Amer: >60 mL/min (ref >60)
Glucose, Bld: 100 mg/dL — ABNORMAL HIGH (ref 70–99)
Potassium: 3.4 mmol/L — ABNORMAL LOW (ref 3.5–5.1)
Sodium: 140 mmol/L (ref 135–145)

## 2018-08-28 LAB — I-STAT BETA HCG BLOOD, ED (MC, WL, AP ONLY): I-stat hCG, quantitative: <5 m[IU]/mL (ref <5)

## 2018-08-28 NOTE — ED Triage Notes (Signed)
Pt endorses left sided chest pain x 4 days, worse with movement and deep inspiration. Denies fever, bodyaches, chills or cough or cardiac hx. VSS

## 2018-08-28 NOTE — ED Notes (Signed)
Pt did not want to wait . Too long of a wait.

## 2018-08-31 ENCOUNTER — Telehealth (HOSPITAL_COMMUNITY): Payer: Self-pay

## 2018-12-08 IMAGING — CT CT NECK W/ CM
4 series · 14 of 33 positions shown, 17 images · IV contrast (Omni 300)
Comparison: None.

CLINICAL DATA: Laryngitis and swallowing discomfort.

EXAM:
CT NECK WITH CONTRAST
TECHNIQUE: Multidetector CT imaging of the neck was performed using the
standard protocol following the bolus administration of intravenous
contrast.
CONTRAST:  75mL [id number] IOPAMIDOL ([id number]) INJECTION 61%

[Series 3: neck 2.0 st · axial · 0.51mm/px · z∈[-214,-70]mm · 5 of 109 slices shown, 7 images (1 of 3)]
[im 19/109  soft-tissue]
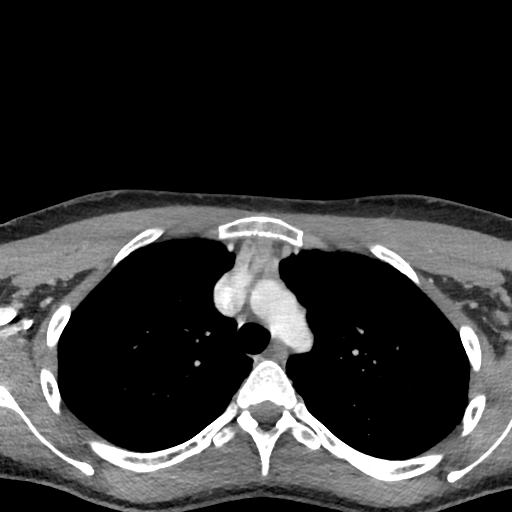
[im 19/109  bone]
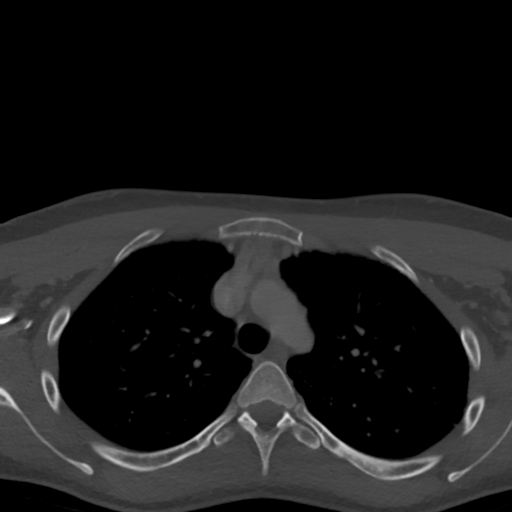
[im 37/109  bone]
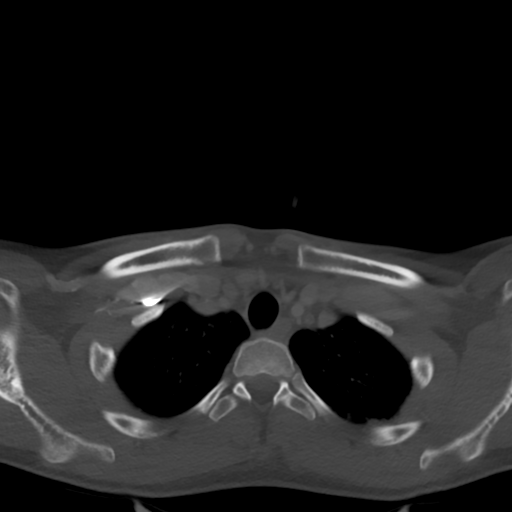
[im 55/109  bone]
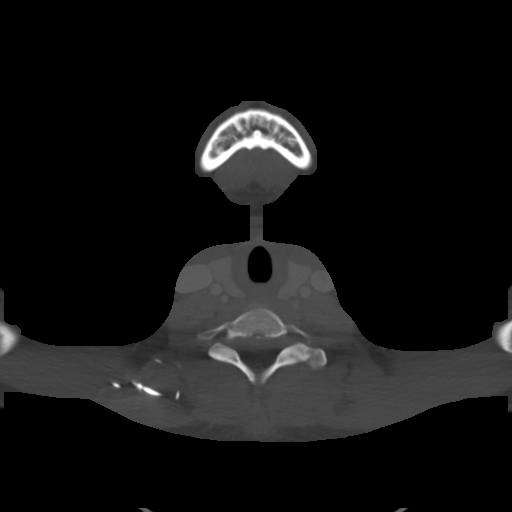
[im 73/109  bone]
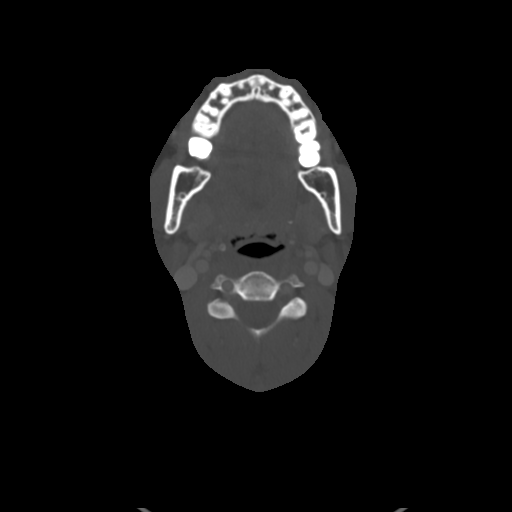
[im 91/109  soft-tissue]
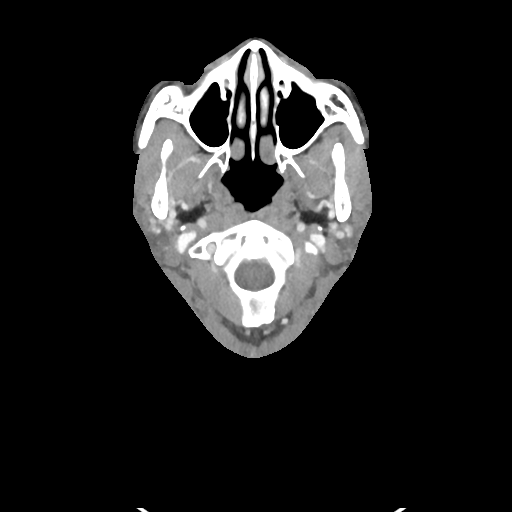
[im 91/109  bone]
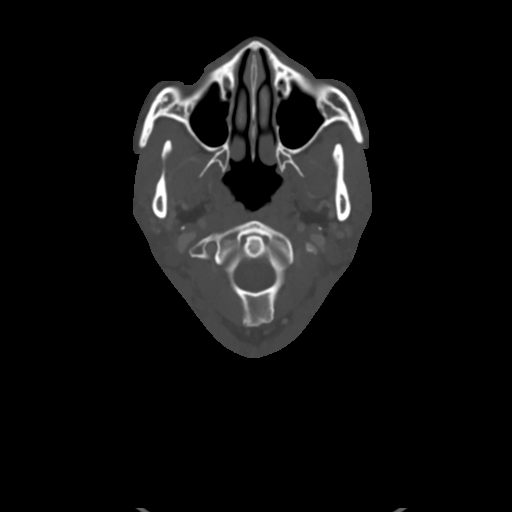

[Series 5: neck 2.0 st · sagittal · 0.42mm/px · 5 of 101 slices shown, 6 images (2 of 3)]
[im 34/101  bone]
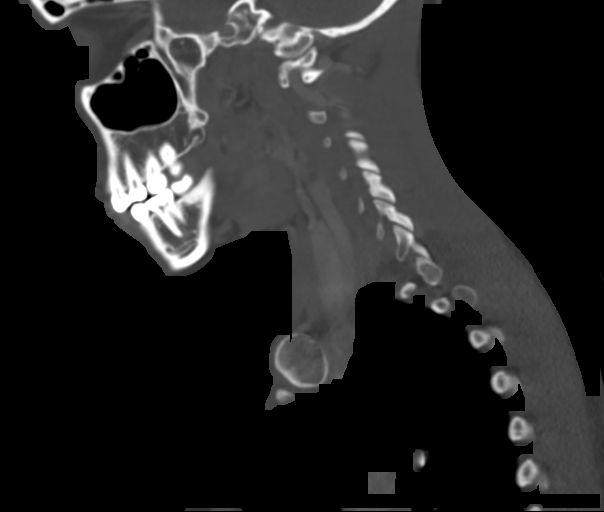
[im 42/101  bone]
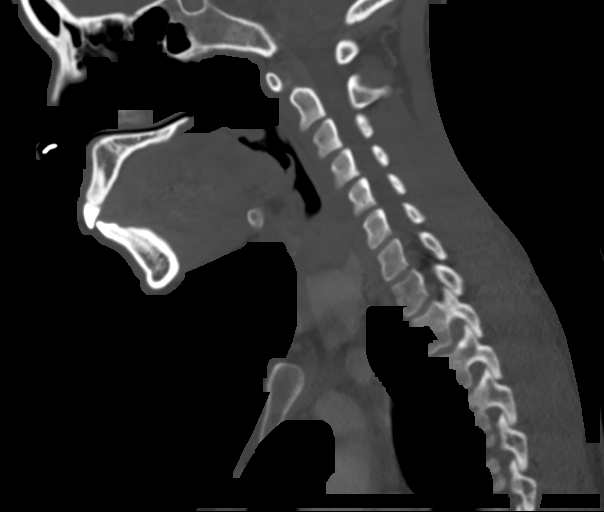
[im 51/101  soft-tissue]
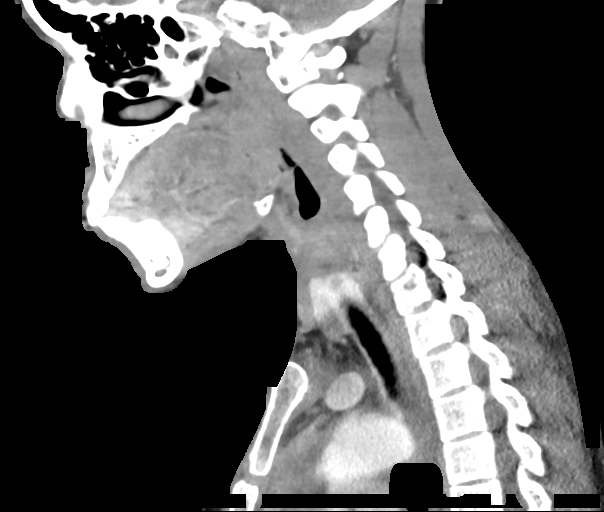
[im 51/101  bone]
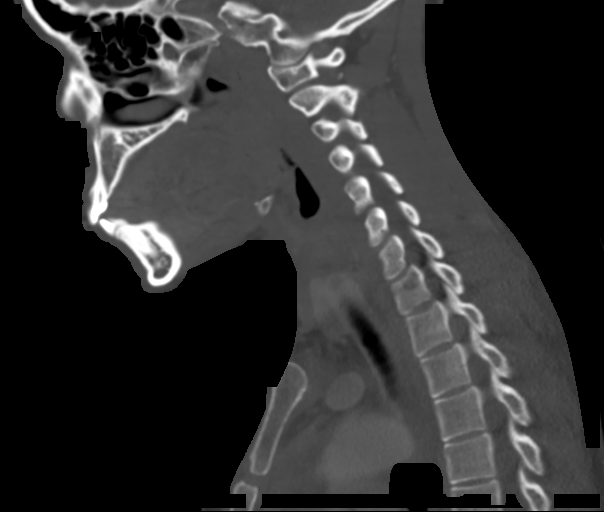
[im 59/101  bone]
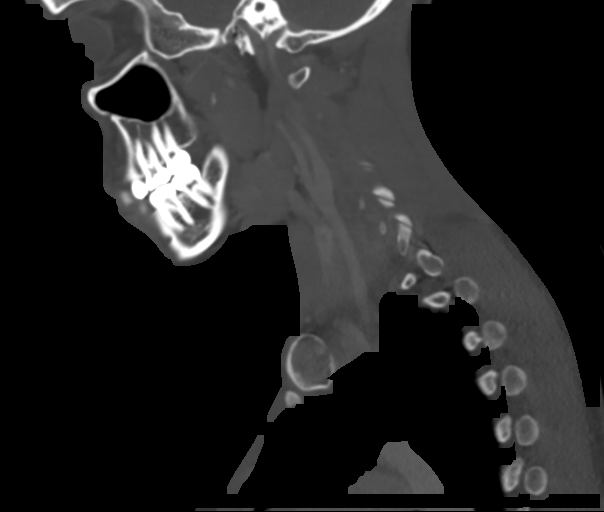
[im 67/101  bone]
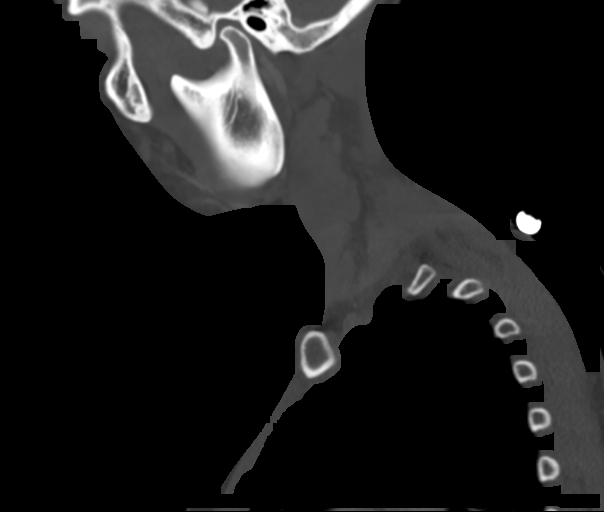

[Series 6: neck 2.0 st · coronal · 0.42mm/px · 3 of 115 slices shown (3 of 3)]
[im 23/115  bone]
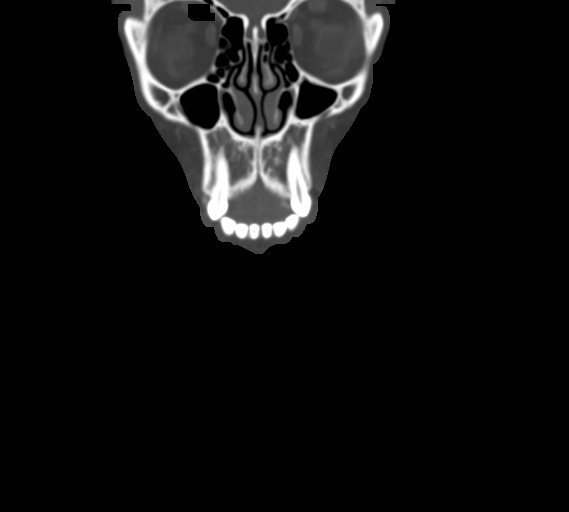
[im 46/115  bone]
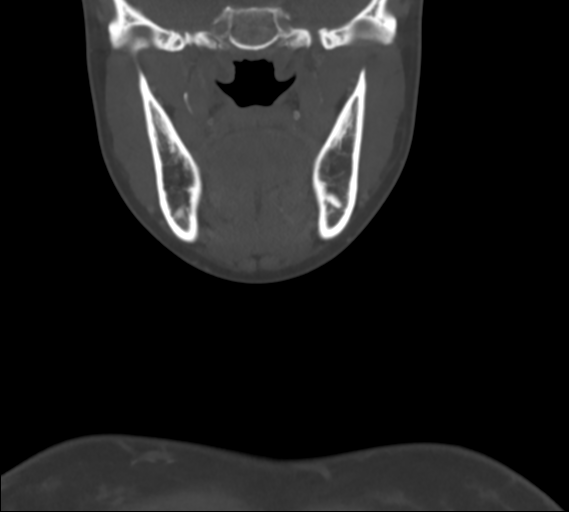
[im 69/115  bone]
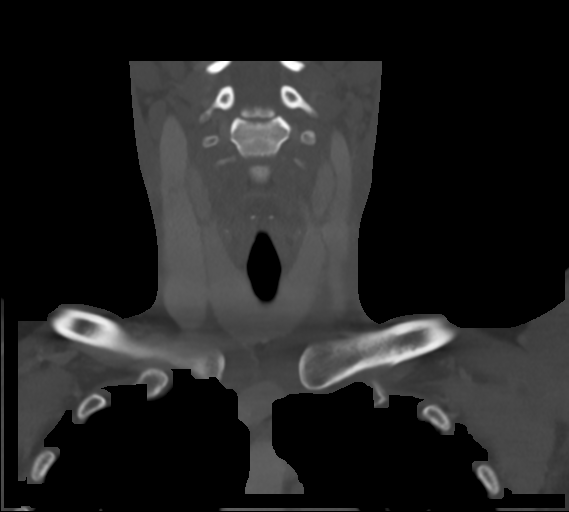

[Series 7: neck 2.0 st orthogonal · axial · 0.39mm/px · 1 of 111 slices shown]
[im 19/111  bone]
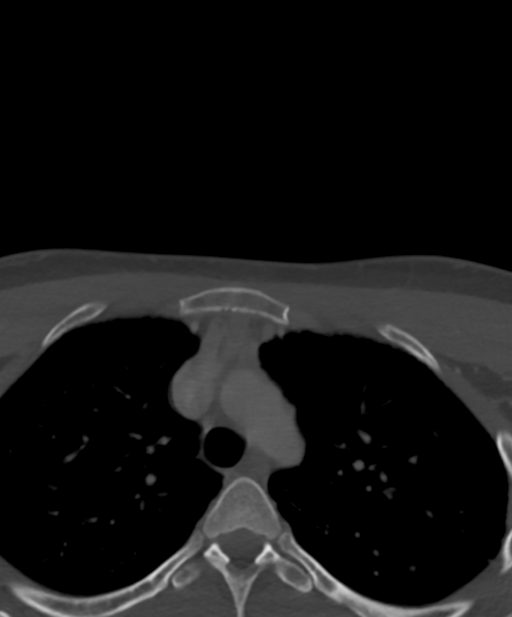

[14 of 33 positions shown; findings below may reference images not displayed]

FINDINGS: Pharynx and larynx:

--Nasopharynx: Fossae of [place] are clear. Normal adenoid
tonsils for age.

--Oral cavity and oropharynx: The palatine and lingual tonsils are
normal. The visible oral cavity and floor of mouth are normal.

--Hypopharynx: Normal vallecula and pyriform sinuses.

--Larynx: Normal epiglottis and pre-epiglottic space. Normal
aryepiglottic and vocal folds.

--Retropharyngeal space: No abscess, effusion or lymphadenopathy.

Salivary glands:

--Parotid: No mass lesion or inflammation. No sialolithiasis or
ductal dilatation.

--Submandibular: Symmetric without inflammation. No sialolithiasis
or ductal dilatation.

--Sublingual: Normal. No ranula or other visible lesion of the base
of tongue and floor of mouth.

Thyroid: Normal.

Lymph nodes: No enlarged or abnormal density lymph nodes.

Vascular: Major cervical vessels are patent.

Limited intracranial: Normal.

Visualized orbits: Normal.

Mastoids and visualized paranasal sinuses: No fluid levels or
advanced mucosal thickening. No mastoid effusion.

Skeleton: No bony spinal canal stenosis. No lytic or blastic
lesions.

Upper chest: Clear.

Other: None.
IMPRESSION: Normal CT of the neck. No findings to explain the reported symptoms.

## 2019-08-09 ENCOUNTER — Encounter (HOSPITAL_COMMUNITY): Payer: Self-pay | Admitting: Emergency Medicine

## 2019-08-09 ENCOUNTER — Emergency Department (HOSPITAL_COMMUNITY)
Admission: EM | Admit: 2019-08-09 | Discharge: 2019-08-10 | Disposition: A | Discharge status: Home / Self Care | Payer: Medicaid Other | Attending: Emergency Medicine | Admitting: Emergency Medicine

## 2019-08-09 ENCOUNTER — Other Ambulatory Visit: Payer: Self-pay

## 2019-08-09 DIAGNOSIS — Z5321 Procedure and treatment not carried out due to patient leaving prior to being seen by health care provider: Secondary | ICD-10-CM | POA: Insufficient documentation

## 2019-08-09 DIAGNOSIS — R197 Diarrhea, unspecified: Secondary | ICD-10-CM | POA: Diagnosis not present

## 2019-08-09 DIAGNOSIS — R112 Nausea with vomiting, unspecified: Secondary | ICD-10-CM | POA: Diagnosis not present

## 2019-08-09 LAB — CBC
HCT: 40.5 % (ref 36.0–46.0)
Hemoglobin: 13.5 g/dL (ref 12.0–15.0)
MCH: 32.5 pg (ref 26.0–34.0)
MCHC: 33.3 g/dL (ref 30.0–36.0)
MCV: 97.6 fL (ref 80.0–100.0)
Platelets: 251 10*3/uL (ref 150–400)
RBC: 4.15 MIL/uL (ref 3.87–5.11)
RDW: 12.9 % (ref 11.5–15.5)
WBC: 27.2 10*3/uL — ABNORMAL HIGH (ref 4.0–10.5)
nRBC: 0 % (ref 0.0–0.2)

## 2019-08-09 LAB — COMPREHENSIVE METABOLIC PANEL
ALT: 29 U/L (ref 0–44)
AST: 25 U/L (ref 15–41)
Albumin: 4.5 g/dL (ref 3.5–5.0)
Alkaline Phosphatase: 30 U/L — ABNORMAL LOW (ref 38–126)
Anion gap: 12 (ref 5–15)
BUN: 11 mg/dL (ref 6–20)
CO2: 24 mmol/L (ref 22–32)
Calcium: 9.4 mg/dL (ref 8.9–10.3)
Chloride: 103 mmol/L (ref 98–111)
Creatinine, Ser: 0.6 mg/dL (ref 0.44–1.00)
GFR calc Af Amer: >60 mL/min (ref >60)
GFR calc non Af Amer: >60 mL/min (ref >60)
Glucose, Bld: 146 mg/dL — ABNORMAL HIGH (ref 70–99)
Potassium: 4.1 mmol/L (ref 3.5–5.1)
Sodium: 139 mmol/L (ref 135–145)
Total Bilirubin: 1 mg/dL (ref 0.3–1.2)
Total Protein: 7.7 g/dL (ref 6.5–8.1)

## 2019-08-09 LAB — I-STAT BETA HCG BLOOD, ED (MC, WL, AP ONLY): I-stat hCG, quantitative: <5 m[IU]/mL (ref <5)

## 2019-08-09 LAB — LIPASE, BLOOD: Lipase: 18 U/L (ref 11–51)

## 2019-08-09 NOTE — ED Triage Notes (Signed)
Patient c/o N/V/D and chills x2 hours.

## 2019-08-10 NOTE — ED Notes (Signed)
Pt gave her stickers to registration. 

## 2020-07-31 DIAGNOSIS — M2540 Effusion, unspecified joint: Secondary | ICD-10-CM | POA: Insufficient documentation

## 2020-07-31 DIAGNOSIS — R21 Rash and other nonspecific skin eruption: Secondary | ICD-10-CM | POA: Insufficient documentation

## 2020-11-24 IMAGING — DX CHEST - 2 VIEW
2 series · 2 of 2 positions shown · non-contrast
Comparison: None.

CLINICAL DATA: Chest pain 4 days

EXAM:
CHEST - 2 VIEW

[w chest pa]
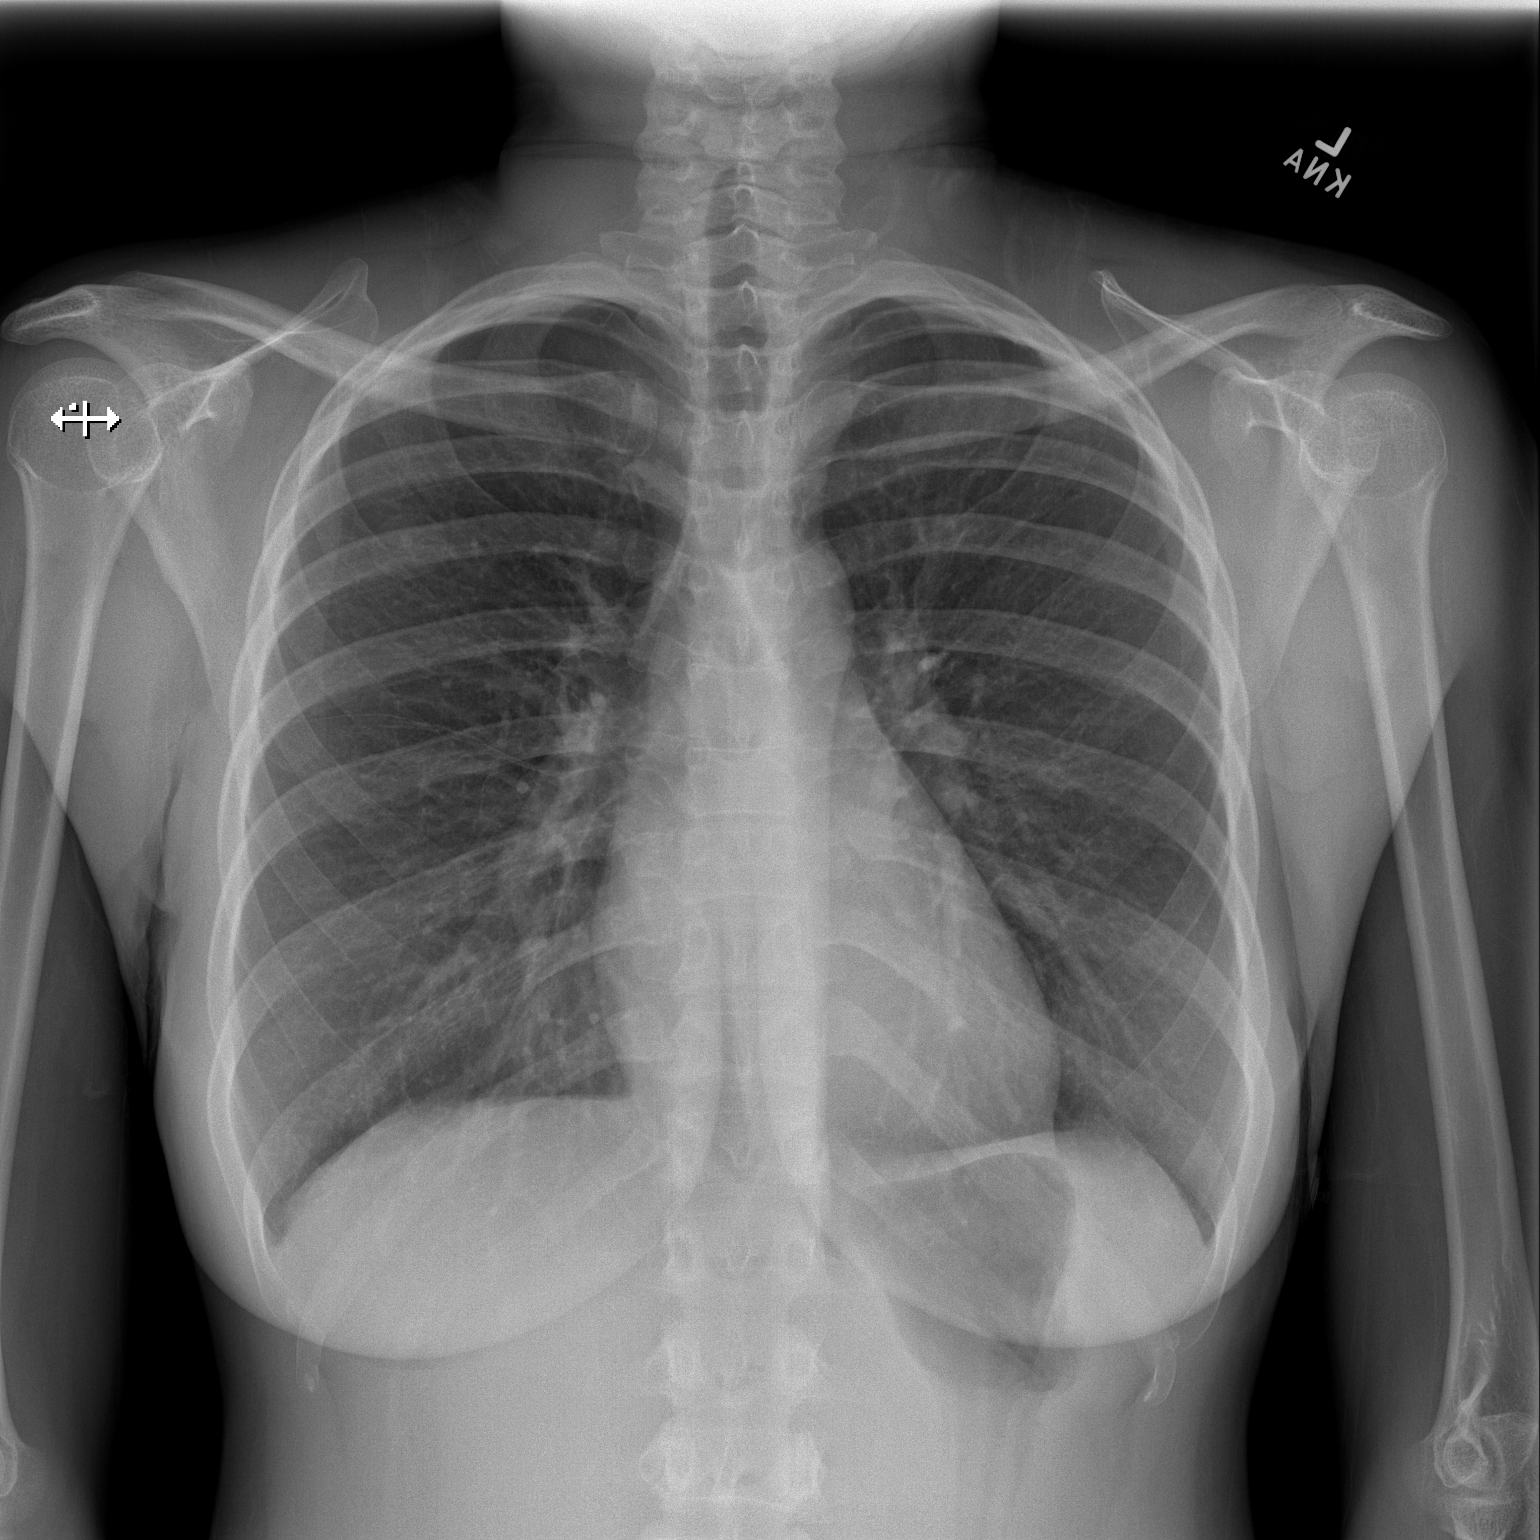

[w chest lat]
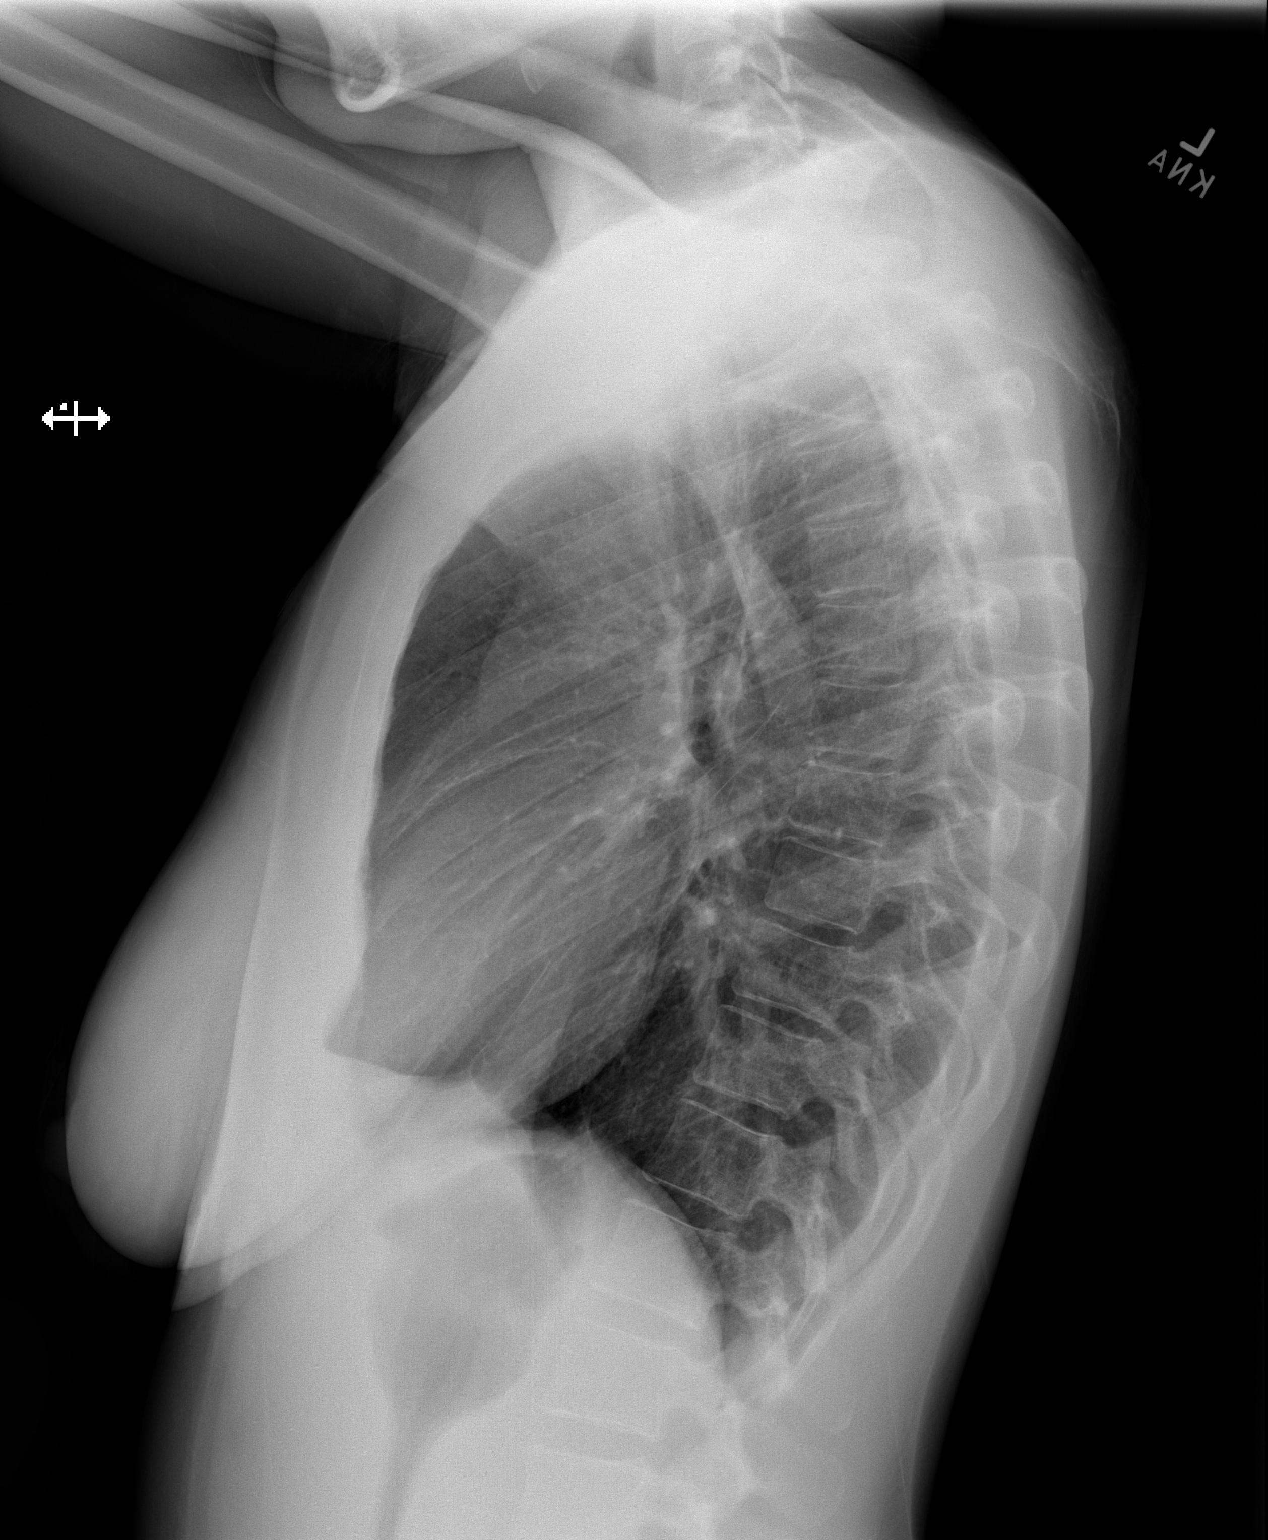

[2 of 2 positions shown; findings below may reference images not displayed]

FINDINGS: The heart size and mediastinal contours are within normal limits.
Both lungs are clear. The visualized skeletal structures are
unremarkable.
IMPRESSION: Clear lungs.

## 2022-09-30 DIAGNOSIS — F331 Major depressive disorder, recurrent, moderate: Secondary | ICD-10-CM | POA: Insufficient documentation

## 2023-01-13 ENCOUNTER — Ambulatory Visit: Payer: Medicaid Other | Admitting: Obstetrics

## 2023-03-28 ENCOUNTER — Ambulatory Visit: Payer: Medicaid Other | Admitting: Obstetrics
